# Patient Record
Sex: Male | Born: 1940
Health system: Southern US, Community
[De-identification: ages and names within clinical notes are randomized; demographics above are authoritative.]

## PROBLEM LIST (undated history)

## (undated) DIAGNOSIS — K259 Gastric ulcer, unspecified as acute or chronic, without hemorrhage or perforation: Secondary | ICD-10-CM

## (undated) DIAGNOSIS — J45909 Unspecified asthma, uncomplicated: Secondary | ICD-10-CM

## (undated) DIAGNOSIS — C189 Malignant neoplasm of colon, unspecified: Secondary | ICD-10-CM

## (undated) DIAGNOSIS — E785 Hyperlipidemia, unspecified: Secondary | ICD-10-CM

## (undated) DIAGNOSIS — I10 Essential (primary) hypertension: Secondary | ICD-10-CM

## (undated) DIAGNOSIS — K219 Gastro-esophageal reflux disease without esophagitis: Secondary | ICD-10-CM

## (undated) DIAGNOSIS — K589 Irritable bowel syndrome without diarrhea: Secondary | ICD-10-CM

## (undated) HISTORY — DX: Gastro-esophageal reflux disease without esophagitis: K21.9

## (undated) HISTORY — PX: REPLACEMENT TOTAL KNEE BILATERAL: SUR1225

## (undated) HISTORY — DX: Unspecified asthma, uncomplicated: J45.909

## (undated) HISTORY — PX: EXPLORATORY LAPAROTOMY W/ BOWEL RESECTION: SHX1544

## (undated) HISTORY — PX: NASAL SINUS SURGERY: SHX719

## (undated) HISTORY — DX: Irritable bowel syndrome, unspecified: K58.9

## (undated) HISTORY — PX: KNEE ARTHROSCOPY: SUR90

---

## 2011-11-23 HISTORY — PX: EXPLORATORY LAPAROTOMY W/ BOWEL RESECTION: SHX1544

## 2015-12-16 DIAGNOSIS — L600 Ingrowing nail: Secondary | ICD-10-CM | POA: Diagnosis not present

## 2015-12-22 DIAGNOSIS — L290 Pruritus ani: Secondary | ICD-10-CM | POA: Diagnosis not present

## 2015-12-22 DIAGNOSIS — K629 Disease of anus and rectum, unspecified: Secondary | ICD-10-CM | POA: Diagnosis not present

## 2015-12-22 DIAGNOSIS — R143 Flatulence: Secondary | ICD-10-CM | POA: Diagnosis not present

## 2016-01-29 DIAGNOSIS — L290 Pruritus ani: Secondary | ICD-10-CM | POA: Diagnosis not present

## 2016-01-29 DIAGNOSIS — K641 Second degree hemorrhoids: Secondary | ICD-10-CM | POA: Diagnosis not present

## 2016-01-29 DIAGNOSIS — D125 Benign neoplasm of sigmoid colon: Secondary | ICD-10-CM | POA: Diagnosis not present

## 2016-01-29 DIAGNOSIS — K621 Rectal polyp: Secondary | ICD-10-CM | POA: Diagnosis not present

## 2016-03-02 DIAGNOSIS — L600 Ingrowing nail: Secondary | ICD-10-CM | POA: Diagnosis not present

## 2016-03-19 DIAGNOSIS — E785 Hyperlipidemia, unspecified: Secondary | ICD-10-CM | POA: Diagnosis not present

## 2016-03-19 DIAGNOSIS — I10 Essential (primary) hypertension: Secondary | ICD-10-CM | POA: Diagnosis not present

## 2016-03-19 DIAGNOSIS — M109 Gout, unspecified: Secondary | ICD-10-CM | POA: Diagnosis not present

## 2016-03-19 DIAGNOSIS — F341 Dysthymic disorder: Secondary | ICD-10-CM | POA: Diagnosis not present

## 2016-03-19 DIAGNOSIS — E669 Obesity, unspecified: Secondary | ICD-10-CM | POA: Diagnosis not present

## 2016-03-30 DIAGNOSIS — C187 Malignant neoplasm of sigmoid colon: Secondary | ICD-10-CM | POA: Diagnosis not present

## 2016-04-14 DIAGNOSIS — F4323 Adjustment disorder with mixed anxiety and depressed mood: Secondary | ICD-10-CM | POA: Diagnosis not present

## 2016-04-21 DIAGNOSIS — F4323 Adjustment disorder with mixed anxiety and depressed mood: Secondary | ICD-10-CM | POA: Diagnosis not present

## 2016-04-26 DIAGNOSIS — F4323 Adjustment disorder with mixed anxiety and depressed mood: Secondary | ICD-10-CM | POA: Diagnosis not present

## 2016-05-06 DIAGNOSIS — F4323 Adjustment disorder with mixed anxiety and depressed mood: Secondary | ICD-10-CM | POA: Diagnosis not present

## 2016-05-11 DIAGNOSIS — L600 Ingrowing nail: Secondary | ICD-10-CM | POA: Diagnosis not present

## 2016-05-11 DIAGNOSIS — F4323 Adjustment disorder with mixed anxiety and depressed mood: Secondary | ICD-10-CM | POA: Diagnosis not present

## 2016-05-19 DIAGNOSIS — F4323 Adjustment disorder with mixed anxiety and depressed mood: Secondary | ICD-10-CM | POA: Diagnosis not present

## 2016-05-20 DIAGNOSIS — J018 Other acute sinusitis: Secondary | ICD-10-CM | POA: Diagnosis not present

## 2016-05-20 DIAGNOSIS — R05 Cough: Secondary | ICD-10-CM | POA: Diagnosis not present

## 2016-05-26 DIAGNOSIS — F4323 Adjustment disorder with mixed anxiety and depressed mood: Secondary | ICD-10-CM | POA: Diagnosis not present

## 2016-06-01 DIAGNOSIS — F4323 Adjustment disorder with mixed anxiety and depressed mood: Secondary | ICD-10-CM | POA: Diagnosis not present

## 2016-06-01 DIAGNOSIS — Z0000 Encounter for general adult medical examination without abnormal findings: Secondary | ICD-10-CM | POA: Diagnosis not present

## 2016-06-08 DIAGNOSIS — F4323 Adjustment disorder with mixed anxiety and depressed mood: Secondary | ICD-10-CM | POA: Diagnosis not present

## 2016-06-15 DIAGNOSIS — F4323 Adjustment disorder with mixed anxiety and depressed mood: Secondary | ICD-10-CM | POA: Diagnosis not present

## 2016-06-18 DIAGNOSIS — R918 Other nonspecific abnormal finding of lung field: Secondary | ICD-10-CM | POA: Diagnosis not present

## 2016-06-18 DIAGNOSIS — R509 Fever, unspecified: Secondary | ICD-10-CM | POA: Diagnosis not present

## 2016-06-18 DIAGNOSIS — J449 Chronic obstructive pulmonary disease, unspecified: Secondary | ICD-10-CM | POA: Diagnosis not present

## 2016-06-20 DIAGNOSIS — Z85038 Personal history of other malignant neoplasm of large intestine: Secondary | ICD-10-CM | POA: Diagnosis not present

## 2016-06-20 DIAGNOSIS — R600 Localized edema: Secondary | ICD-10-CM | POA: Diagnosis not present

## 2016-06-20 DIAGNOSIS — E7800 Pure hypercholesterolemia, unspecified: Secondary | ICD-10-CM | POA: Diagnosis not present

## 2016-06-20 DIAGNOSIS — Z96653 Presence of artificial knee joint, bilateral: Secondary | ICD-10-CM | POA: Diagnosis not present

## 2016-06-20 DIAGNOSIS — G4730 Sleep apnea, unspecified: Secondary | ICD-10-CM | POA: Diagnosis not present

## 2016-06-20 DIAGNOSIS — R0609 Other forms of dyspnea: Secondary | ICD-10-CM | POA: Diagnosis not present

## 2016-06-20 DIAGNOSIS — I10 Essential (primary) hypertension: Secondary | ICD-10-CM | POA: Diagnosis not present

## 2016-06-20 DIAGNOSIS — R0789 Other chest pain: Secondary | ICD-10-CM | POA: Diagnosis not present

## 2016-06-20 DIAGNOSIS — Z7982 Long term (current) use of aspirin: Secondary | ICD-10-CM | POA: Diagnosis not present

## 2016-06-20 DIAGNOSIS — E876 Hypokalemia: Secondary | ICD-10-CM | POA: Diagnosis not present

## 2016-06-20 DIAGNOSIS — R0600 Dyspnea, unspecified: Secondary | ICD-10-CM | POA: Diagnosis not present

## 2016-06-20 DIAGNOSIS — R609 Edema, unspecified: Secondary | ICD-10-CM | POA: Diagnosis not present

## 2016-06-22 DIAGNOSIS — M7989 Other specified soft tissue disorders: Secondary | ICD-10-CM | POA: Diagnosis not present

## 2016-06-22 DIAGNOSIS — Z7982 Long term (current) use of aspirin: Secondary | ICD-10-CM | POA: Diagnosis not present

## 2016-06-22 DIAGNOSIS — Z85038 Personal history of other malignant neoplasm of large intestine: Secondary | ICD-10-CM | POA: Diagnosis not present

## 2016-07-06 DIAGNOSIS — F4323 Adjustment disorder with mixed anxiety and depressed mood: Secondary | ICD-10-CM | POA: Diagnosis not present

## 2016-07-13 DIAGNOSIS — F4323 Adjustment disorder with mixed anxiety and depressed mood: Secondary | ICD-10-CM | POA: Diagnosis not present

## 2016-07-14 DIAGNOSIS — G4733 Obstructive sleep apnea (adult) (pediatric): Secondary | ICD-10-CM | POA: Diagnosis not present

## 2016-07-20 DIAGNOSIS — L600 Ingrowing nail: Secondary | ICD-10-CM | POA: Diagnosis not present

## 2016-07-20 DIAGNOSIS — F4323 Adjustment disorder with mixed anxiety and depressed mood: Secondary | ICD-10-CM | POA: Diagnosis not present

## 2016-07-22 DIAGNOSIS — M2550 Pain in unspecified joint: Secondary | ICD-10-CM | POA: Diagnosis not present

## 2016-07-22 DIAGNOSIS — J301 Allergic rhinitis due to pollen: Secondary | ICD-10-CM | POA: Diagnosis not present

## 2016-07-22 DIAGNOSIS — C189 Malignant neoplasm of colon, unspecified: Secondary | ICD-10-CM | POA: Diagnosis not present

## 2016-07-22 DIAGNOSIS — R600 Localized edema: Secondary | ICD-10-CM | POA: Diagnosis not present

## 2016-07-22 DIAGNOSIS — E785 Hyperlipidemia, unspecified: Secondary | ICD-10-CM | POA: Diagnosis not present

## 2016-07-22 DIAGNOSIS — I10 Essential (primary) hypertension: Secondary | ICD-10-CM | POA: Diagnosis not present

## 2016-07-22 DIAGNOSIS — E559 Vitamin D deficiency, unspecified: Secondary | ICD-10-CM | POA: Diagnosis not present

## 2016-07-22 DIAGNOSIS — E798 Other disorders of purine and pyrimidine metabolism: Secondary | ICD-10-CM | POA: Diagnosis not present

## 2016-07-22 DIAGNOSIS — E784 Other hyperlipidemia: Secondary | ICD-10-CM | POA: Diagnosis not present

## 2016-07-29 DIAGNOSIS — F4323 Adjustment disorder with mixed anxiety and depressed mood: Secondary | ICD-10-CM | POA: Diagnosis not present

## 2016-08-04 DIAGNOSIS — F4323 Adjustment disorder with mixed anxiety and depressed mood: Secondary | ICD-10-CM | POA: Diagnosis not present

## 2016-08-10 DIAGNOSIS — F4323 Adjustment disorder with mixed anxiety and depressed mood: Secondary | ICD-10-CM | POA: Diagnosis not present

## 2016-08-11 DIAGNOSIS — E876 Hypokalemia: Secondary | ICD-10-CM | POA: Diagnosis not present

## 2016-08-11 DIAGNOSIS — I10 Essential (primary) hypertension: Secondary | ICD-10-CM | POA: Diagnosis not present

## 2016-08-11 DIAGNOSIS — E782 Mixed hyperlipidemia: Secondary | ICD-10-CM | POA: Diagnosis not present

## 2016-08-11 DIAGNOSIS — R600 Localized edema: Secondary | ICD-10-CM | POA: Diagnosis not present

## 2016-08-11 DIAGNOSIS — R0602 Shortness of breath: Secondary | ICD-10-CM | POA: Diagnosis not present

## 2016-08-11 DIAGNOSIS — I471 Supraventricular tachycardia: Secondary | ICD-10-CM | POA: Diagnosis not present

## 2016-08-17 DIAGNOSIS — F4323 Adjustment disorder with mixed anxiety and depressed mood: Secondary | ICD-10-CM | POA: Diagnosis not present

## 2016-08-18 DIAGNOSIS — R0602 Shortness of breath: Secondary | ICD-10-CM | POA: Diagnosis not present

## 2016-08-18 DIAGNOSIS — R600 Localized edema: Secondary | ICD-10-CM | POA: Diagnosis not present

## 2016-08-18 DIAGNOSIS — R609 Edema, unspecified: Secondary | ICD-10-CM | POA: Diagnosis not present

## 2016-08-24 DIAGNOSIS — F4323 Adjustment disorder with mixed anxiety and depressed mood: Secondary | ICD-10-CM | POA: Diagnosis not present

## 2016-08-31 DIAGNOSIS — F4323 Adjustment disorder with mixed anxiety and depressed mood: Secondary | ICD-10-CM | POA: Diagnosis not present

## 2016-09-07 DIAGNOSIS — F4323 Adjustment disorder with mixed anxiety and depressed mood: Secondary | ICD-10-CM | POA: Diagnosis not present

## 2016-09-21 DIAGNOSIS — F4323 Adjustment disorder with mixed anxiety and depressed mood: Secondary | ICD-10-CM | POA: Diagnosis not present

## 2016-09-28 DIAGNOSIS — L600 Ingrowing nail: Secondary | ICD-10-CM | POA: Diagnosis not present

## 2016-09-29 DIAGNOSIS — F4323 Adjustment disorder with mixed anxiety and depressed mood: Secondary | ICD-10-CM | POA: Diagnosis not present

## 2016-10-05 DIAGNOSIS — F4323 Adjustment disorder with mixed anxiety and depressed mood: Secondary | ICD-10-CM | POA: Diagnosis not present

## 2016-10-19 DIAGNOSIS — F4323 Adjustment disorder with mixed anxiety and depressed mood: Secondary | ICD-10-CM | POA: Diagnosis not present

## 2016-10-26 DIAGNOSIS — F4323 Adjustment disorder with mixed anxiety and depressed mood: Secondary | ICD-10-CM | POA: Diagnosis not present

## 2016-11-09 DIAGNOSIS — F4323 Adjustment disorder with mixed anxiety and depressed mood: Secondary | ICD-10-CM | POA: Diagnosis not present

## 2016-11-30 DIAGNOSIS — J0140 Acute pansinusitis, unspecified: Secondary | ICD-10-CM | POA: Diagnosis not present

## 2016-12-07 DIAGNOSIS — F4323 Adjustment disorder with mixed anxiety and depressed mood: Secondary | ICD-10-CM | POA: Diagnosis not present

## 2016-12-08 DIAGNOSIS — E782 Mixed hyperlipidemia: Secondary | ICD-10-CM | POA: Diagnosis not present

## 2016-12-08 DIAGNOSIS — I471 Supraventricular tachycardia: Secondary | ICD-10-CM | POA: Diagnosis not present

## 2016-12-08 DIAGNOSIS — I10 Essential (primary) hypertension: Secondary | ICD-10-CM | POA: Diagnosis not present

## 2016-12-14 DIAGNOSIS — F4323 Adjustment disorder with mixed anxiety and depressed mood: Secondary | ICD-10-CM | POA: Diagnosis not present

## 2016-12-21 DIAGNOSIS — F4323 Adjustment disorder with mixed anxiety and depressed mood: Secondary | ICD-10-CM | POA: Diagnosis not present

## 2016-12-22 DIAGNOSIS — L600 Ingrowing nail: Secondary | ICD-10-CM | POA: Diagnosis not present

## 2016-12-28 DIAGNOSIS — F4323 Adjustment disorder with mixed anxiety and depressed mood: Secondary | ICD-10-CM | POA: Diagnosis not present

## 2017-01-04 DIAGNOSIS — F4323 Adjustment disorder with mixed anxiety and depressed mood: Secondary | ICD-10-CM | POA: Diagnosis not present

## 2017-01-11 DIAGNOSIS — F4323 Adjustment disorder with mixed anxiety and depressed mood: Secondary | ICD-10-CM | POA: Diagnosis not present

## 2017-01-18 DIAGNOSIS — F4323 Adjustment disorder with mixed anxiety and depressed mood: Secondary | ICD-10-CM | POA: Diagnosis not present

## 2017-02-15 DIAGNOSIS — F4323 Adjustment disorder with mixed anxiety and depressed mood: Secondary | ICD-10-CM | POA: Diagnosis not present

## 2017-02-22 DIAGNOSIS — F4323 Adjustment disorder with mixed anxiety and depressed mood: Secondary | ICD-10-CM | POA: Diagnosis not present

## 2017-03-02 DIAGNOSIS — L600 Ingrowing nail: Secondary | ICD-10-CM | POA: Diagnosis not present

## 2017-03-08 DIAGNOSIS — F4323 Adjustment disorder with mixed anxiety and depressed mood: Secondary | ICD-10-CM | POA: Diagnosis not present

## 2017-03-09 DIAGNOSIS — L821 Other seborrheic keratosis: Secondary | ICD-10-CM | POA: Diagnosis not present

## 2017-03-15 DIAGNOSIS — F4323 Adjustment disorder with mixed anxiety and depressed mood: Secondary | ICD-10-CM | POA: Diagnosis not present

## 2017-03-22 DIAGNOSIS — F4323 Adjustment disorder with mixed anxiety and depressed mood: Secondary | ICD-10-CM | POA: Diagnosis not present

## 2017-03-29 DIAGNOSIS — E782 Mixed hyperlipidemia: Secondary | ICD-10-CM | POA: Diagnosis not present

## 2017-03-29 DIAGNOSIS — I10 Essential (primary) hypertension: Secondary | ICD-10-CM | POA: Diagnosis not present

## 2017-03-29 DIAGNOSIS — F4323 Adjustment disorder with mixed anxiety and depressed mood: Secondary | ICD-10-CM | POA: Diagnosis not present

## 2017-03-29 DIAGNOSIS — F329 Major depressive disorder, single episode, unspecified: Secondary | ICD-10-CM | POA: Diagnosis not present

## 2017-03-29 DIAGNOSIS — N401 Enlarged prostate with lower urinary tract symptoms: Secondary | ICD-10-CM | POA: Diagnosis not present

## 2017-04-14 DIAGNOSIS — M25561 Pain in right knee: Secondary | ICD-10-CM | POA: Diagnosis not present

## 2017-04-14 DIAGNOSIS — M25562 Pain in left knee: Secondary | ICD-10-CM | POA: Diagnosis not present

## 2017-05-18 DIAGNOSIS — M79672 Pain in left foot: Secondary | ICD-10-CM | POA: Diagnosis not present

## 2017-05-18 DIAGNOSIS — I739 Peripheral vascular disease, unspecified: Secondary | ICD-10-CM | POA: Diagnosis not present

## 2017-05-18 DIAGNOSIS — L600 Ingrowing nail: Secondary | ICD-10-CM | POA: Diagnosis not present

## 2017-05-18 DIAGNOSIS — L603 Nail dystrophy: Secondary | ICD-10-CM | POA: Diagnosis not present

## 2017-05-18 DIAGNOSIS — M79671 Pain in right foot: Secondary | ICD-10-CM | POA: Diagnosis not present

## 2017-05-31 DIAGNOSIS — K644 Residual hemorrhoidal skin tags: Secondary | ICD-10-CM | POA: Diagnosis not present

## 2017-06-07 DIAGNOSIS — L600 Ingrowing nail: Secondary | ICD-10-CM | POA: Diagnosis not present

## 2017-06-20 DIAGNOSIS — N400 Enlarged prostate without lower urinary tract symptoms: Secondary | ICD-10-CM | POA: Diagnosis not present

## 2017-06-20 DIAGNOSIS — Z85038 Personal history of other malignant neoplasm of large intestine: Secondary | ICD-10-CM | POA: Diagnosis not present

## 2017-06-20 DIAGNOSIS — I10 Essential (primary) hypertension: Secondary | ICD-10-CM | POA: Diagnosis not present

## 2017-06-20 DIAGNOSIS — E782 Mixed hyperlipidemia: Secondary | ICD-10-CM | POA: Diagnosis not present

## 2017-06-20 DIAGNOSIS — G4733 Obstructive sleep apnea (adult) (pediatric): Secondary | ICD-10-CM | POA: Diagnosis not present

## 2017-06-20 DIAGNOSIS — F3342 Major depressive disorder, recurrent, in full remission: Secondary | ICD-10-CM | POA: Diagnosis not present

## 2017-06-20 DIAGNOSIS — J3089 Other allergic rhinitis: Secondary | ICD-10-CM | POA: Diagnosis not present

## 2017-07-04 DIAGNOSIS — K644 Residual hemorrhoidal skin tags: Secondary | ICD-10-CM | POA: Diagnosis not present

## 2017-08-01 DIAGNOSIS — D126 Benign neoplasm of colon, unspecified: Secondary | ICD-10-CM | POA: Diagnosis not present

## 2017-08-01 DIAGNOSIS — K640 First degree hemorrhoids: Secondary | ICD-10-CM | POA: Diagnosis not present

## 2017-08-01 DIAGNOSIS — Z980 Intestinal bypass and anastomosis status: Secondary | ICD-10-CM | POA: Diagnosis not present

## 2017-08-01 DIAGNOSIS — Z85038 Personal history of other malignant neoplasm of large intestine: Secondary | ICD-10-CM | POA: Diagnosis not present

## 2017-08-01 DIAGNOSIS — K5730 Diverticulosis of large intestine without perforation or abscess without bleeding: Secondary | ICD-10-CM | POA: Diagnosis not present

## 2017-08-03 DIAGNOSIS — D126 Benign neoplasm of colon, unspecified: Secondary | ICD-10-CM | POA: Diagnosis not present

## 2017-08-09 DIAGNOSIS — F4323 Adjustment disorder with mixed anxiety and depressed mood: Secondary | ICD-10-CM | POA: Diagnosis not present

## 2017-08-09 DIAGNOSIS — L600 Ingrowing nail: Secondary | ICD-10-CM | POA: Diagnosis not present

## 2017-08-16 DIAGNOSIS — F4323 Adjustment disorder with mixed anxiety and depressed mood: Secondary | ICD-10-CM | POA: Diagnosis not present

## 2017-09-29 DIAGNOSIS — L03031 Cellulitis of right toe: Secondary | ICD-10-CM | POA: Diagnosis not present

## 2017-09-29 DIAGNOSIS — L03032 Cellulitis of left toe: Secondary | ICD-10-CM | POA: Diagnosis not present

## 2017-09-29 DIAGNOSIS — L602 Onychogryphosis: Secondary | ICD-10-CM | POA: Diagnosis not present

## 2017-10-03 DIAGNOSIS — J0100 Acute maxillary sinusitis, unspecified: Secondary | ICD-10-CM | POA: Diagnosis not present

## 2017-10-06 DIAGNOSIS — F4323 Adjustment disorder with mixed anxiety and depressed mood: Secondary | ICD-10-CM | POA: Diagnosis not present

## 2017-10-17 DIAGNOSIS — J018 Other acute sinusitis: Secondary | ICD-10-CM | POA: Diagnosis not present

## 2017-12-01 DIAGNOSIS — L570 Actinic keratosis: Secondary | ICD-10-CM | POA: Diagnosis not present

## 2017-12-01 DIAGNOSIS — D485 Neoplasm of uncertain behavior of skin: Secondary | ICD-10-CM | POA: Diagnosis not present

## 2017-12-01 DIAGNOSIS — Z23 Encounter for immunization: Secondary | ICD-10-CM | POA: Diagnosis not present

## 2017-12-01 DIAGNOSIS — L738 Other specified follicular disorders: Secondary | ICD-10-CM | POA: Diagnosis not present

## 2017-12-01 DIAGNOSIS — D2262 Melanocytic nevi of left upper limb, including shoulder: Secondary | ICD-10-CM | POA: Diagnosis not present

## 2017-12-01 DIAGNOSIS — L821 Other seborrheic keratosis: Secondary | ICD-10-CM | POA: Diagnosis not present

## 2017-12-26 DIAGNOSIS — L821 Other seborrheic keratosis: Secondary | ICD-10-CM | POA: Diagnosis not present

## 2017-12-26 DIAGNOSIS — L570 Actinic keratosis: Secondary | ICD-10-CM | POA: Diagnosis not present

## 2018-01-24 DIAGNOSIS — L02612 Cutaneous abscess of left foot: Secondary | ICD-10-CM | POA: Diagnosis not present

## 2018-01-24 DIAGNOSIS — L03032 Cellulitis of left toe: Secondary | ICD-10-CM | POA: Diagnosis not present

## 2018-01-24 DIAGNOSIS — M79675 Pain in left toe(s): Secondary | ICD-10-CM | POA: Diagnosis not present

## 2018-02-08 DIAGNOSIS — L03032 Cellulitis of left toe: Secondary | ICD-10-CM | POA: Diagnosis not present

## 2018-02-08 DIAGNOSIS — L03031 Cellulitis of right toe: Secondary | ICD-10-CM | POA: Diagnosis not present

## 2018-02-27 DIAGNOSIS — L03031 Cellulitis of right toe: Secondary | ICD-10-CM | POA: Diagnosis not present

## 2018-03-06 DIAGNOSIS — K2900 Acute gastritis without bleeding: Secondary | ICD-10-CM | POA: Diagnosis not present

## 2018-03-16 DIAGNOSIS — G4733 Obstructive sleep apnea (adult) (pediatric): Secondary | ICD-10-CM | POA: Diagnosis not present

## 2018-03-19 DIAGNOSIS — R1013 Epigastric pain: Secondary | ICD-10-CM | POA: Diagnosis not present

## 2018-03-19 DIAGNOSIS — K120 Recurrent oral aphthae: Secondary | ICD-10-CM | POA: Diagnosis not present

## 2018-03-23 DIAGNOSIS — M170 Bilateral primary osteoarthritis of knee: Secondary | ICD-10-CM | POA: Diagnosis not present

## 2018-03-23 DIAGNOSIS — N400 Enlarged prostate without lower urinary tract symptoms: Secondary | ICD-10-CM | POA: Diagnosis not present

## 2018-03-23 DIAGNOSIS — E782 Mixed hyperlipidemia: Secondary | ICD-10-CM | POA: Diagnosis not present

## 2018-03-23 DIAGNOSIS — I10 Essential (primary) hypertension: Secondary | ICD-10-CM | POA: Diagnosis not present

## 2018-03-23 DIAGNOSIS — R1013 Epigastric pain: Secondary | ICD-10-CM | POA: Diagnosis not present

## 2018-03-27 DIAGNOSIS — R0789 Other chest pain: Secondary | ICD-10-CM | POA: Diagnosis not present

## 2018-03-28 ENCOUNTER — Observation Stay (HOSPITAL_COMMUNITY): Payer: Medicare Other

## 2018-03-28 ENCOUNTER — Emergency Department (HOSPITAL_COMMUNITY): Payer: Medicare Other

## 2018-03-28 ENCOUNTER — Inpatient Hospital Stay (HOSPITAL_COMMUNITY)
Admission: EM | Admit: 2018-03-28 | Discharge: 2018-03-30 | DRG: 419 | Disposition: A | Discharge status: Home / Self Care | Payer: Medicare Other | Attending: Internal Medicine | Admitting: Internal Medicine

## 2018-03-28 ENCOUNTER — Encounter (HOSPITAL_COMMUNITY): Payer: Self-pay

## 2018-03-28 ENCOUNTER — Other Ambulatory Visit: Payer: Self-pay

## 2018-03-28 DIAGNOSIS — Z23 Encounter for immunization: Secondary | ICD-10-CM

## 2018-03-28 DIAGNOSIS — I259 Chronic ischemic heart disease, unspecified: Secondary | ICD-10-CM

## 2018-03-28 DIAGNOSIS — E785 Hyperlipidemia, unspecified: Secondary | ICD-10-CM | POA: Diagnosis not present

## 2018-03-28 DIAGNOSIS — K829 Disease of gallbladder, unspecified: Secondary | ICD-10-CM | POA: Diagnosis not present

## 2018-03-28 DIAGNOSIS — K8020 Calculus of gallbladder without cholecystitis without obstruction: Secondary | ICD-10-CM | POA: Diagnosis not present

## 2018-03-28 DIAGNOSIS — Z79899 Other long term (current) drug therapy: Secondary | ICD-10-CM

## 2018-03-28 DIAGNOSIS — I10 Essential (primary) hypertension: Secondary | ICD-10-CM | POA: Diagnosis not present

## 2018-03-28 DIAGNOSIS — R079 Chest pain, unspecified: Secondary | ICD-10-CM | POA: Diagnosis not present

## 2018-03-28 DIAGNOSIS — R0789 Other chest pain: Secondary | ICD-10-CM | POA: Diagnosis not present

## 2018-03-28 DIAGNOSIS — R1011 Right upper quadrant pain: Secondary | ICD-10-CM | POA: Diagnosis not present

## 2018-03-28 DIAGNOSIS — K82A1 Gangrene of gallbladder in cholecystitis: Secondary | ICD-10-CM | POA: Diagnosis present

## 2018-03-28 DIAGNOSIS — Z8719 Personal history of other diseases of the digestive system: Secondary | ICD-10-CM

## 2018-03-28 DIAGNOSIS — Z6836 Body mass index (BMI) 36.0-36.9, adult: Secondary | ICD-10-CM

## 2018-03-28 DIAGNOSIS — K5792 Diverticulitis of intestine, part unspecified, without perforation or abscess without bleeding: Secondary | ICD-10-CM | POA: Diagnosis not present

## 2018-03-28 DIAGNOSIS — Z8711 Personal history of peptic ulcer disease: Secondary | ICD-10-CM

## 2018-03-28 DIAGNOSIS — K812 Acute cholecystitis with chronic cholecystitis: Principal | ICD-10-CM | POA: Diagnosis present

## 2018-03-28 DIAGNOSIS — E860 Dehydration: Secondary | ICD-10-CM | POA: Diagnosis present

## 2018-03-28 HISTORY — DX: Gastric ulcer, unspecified as acute or chronic, without hemorrhage or perforation: K25.9

## 2018-03-28 HISTORY — DX: Malignant neoplasm of colon, unspecified: C18.9

## 2018-03-28 HISTORY — DX: Hyperlipidemia, unspecified: E78.5

## 2018-03-28 HISTORY — DX: Essential (primary) hypertension: I10

## 2018-03-28 LAB — I-STAT CHEM 8, ED
BUN: 19 mg/dL (ref 6–20)
Calcium, Ion: 1.1 mmol/L — ABNORMAL LOW (ref 1.15–1.40)
Chloride: 98 mmol/L — ABNORMAL LOW (ref 101–111)
Creatinine, Ser: 1.2 mg/dL (ref 0.61–1.24)
Glucose, Bld: 106 mg/dL — ABNORMAL HIGH (ref 65–99)
HCT: 40 % (ref 39.0–52.0)
Hemoglobin: 13.6 g/dL (ref 13.0–17.0)
Potassium: 4 mmol/L (ref 3.5–5.1)
Sodium: 133 mmol/L — ABNORMAL LOW (ref 135–145)
TCO2: 27 mmol/L (ref 22–32)

## 2018-03-28 LAB — LIPID PANEL
Cholesterol: 101 mg/dL (ref 0–200)
HDL: 38 mg/dL — ABNORMAL LOW (ref >40)
LDL Cholesterol: 47 mg/dL (ref 0–99)
Total CHOL/HDL Ratio: 2.7 RATIO
Triglycerides: 78 mg/dL (ref <150)
VLDL: 16 mg/dL (ref 0–40)

## 2018-03-28 LAB — CBC WITH DIFFERENTIAL/PLATELET
Basophils Absolute: 0 10*3/uL (ref 0.0–0.1)
Basophils Relative: 0 %
Eosinophils Absolute: 0.4 10*3/uL (ref 0.0–0.7)
Eosinophils Relative: 4 %
HCT: 41.3 % (ref 39.0–52.0)
Hemoglobin: 14 g/dL (ref 13.0–17.0)
Lymphocytes Relative: 32 %
Lymphs Abs: 2.9 10*3/uL (ref 0.7–4.0)
MCH: 31 pg (ref 26.0–34.0)
MCHC: 33.9 g/dL (ref 30.0–36.0)
MCV: 91.4 fL (ref 78.0–100.0)
Monocytes Absolute: 0.7 10*3/uL (ref 0.1–1.0)
Monocytes Relative: 8 %
Neutro Abs: 4.9 10*3/uL (ref 1.7–7.7)
Neutrophils Relative %: 56 %
Platelets: 223 10*3/uL (ref 150–400)
RBC: 4.52 MIL/uL (ref 4.22–5.81)
RDW: 13.4 % (ref 11.5–15.5)
WBC: 8.9 10*3/uL (ref 4.0–10.5)

## 2018-03-28 LAB — I-STAT TROPONIN, ED: Troponin i, poc: 0.01 ng/mL (ref 0.00–0.08)

## 2018-03-28 LAB — HEMOGLOBIN A1C
Hgb A1c MFr Bld: 5.4 % (ref 4.8–5.6)
Mean Plasma Glucose: 108.28 mg/dL

## 2018-03-28 LAB — HEPATIC FUNCTION PANEL
ALT: 13 U/L — ABNORMAL LOW (ref 17–63)
AST: 18 U/L (ref 15–41)
Albumin: 3.6 g/dL (ref 3.5–5.0)
Alkaline Phosphatase: 75 U/L (ref 38–126)
Bilirubin, Direct: 0.3 mg/dL (ref 0.1–0.5)
Indirect Bilirubin: 0.8 mg/dL (ref 0.3–0.9)
Total Bilirubin: 1.1 mg/dL (ref 0.3–1.2)
Total Protein: 6.6 g/dL (ref 6.5–8.1)

## 2018-03-28 LAB — TROPONIN I
Troponin I: <0.03 ng/mL (ref <0.03)
Troponin I: <0.03 ng/mL (ref <0.03)
Troponin I: <0.03 ng/mL (ref <0.03)

## 2018-03-28 LAB — LIPASE, BLOOD: Lipase: 50 U/L (ref 11–51)

## 2018-03-28 LAB — MRSA PCR SCREENING: MRSA by PCR: NEGATIVE

## 2018-03-28 MED ORDER — PNEUMOCOCCAL VAC POLYVALENT 25 MCG/0.5ML IJ INJ: 0.5000 mL | INJECTION | INTRAMUSCULAR | Status: DC

## 2018-03-28 MED ORDER — ENOXAPARIN SODIUM 40 MG/0.4ML ~~LOC~~ SOLN
40.0000 mg | SUBCUTANEOUS | Status: DC
  Filled 2018-03-28: qty 0.4

## 2018-03-28 MED ORDER — SODIUM CHLORIDE 0.9 % IV SOLN
INTRAVENOUS | Status: DC
  Administered 2018-03-28 – 2018-03-30 (×3): via INTRAVENOUS

## 2018-03-28 MED ORDER — IOPAMIDOL (ISOVUE-370) INJECTION 76%
INTRAVENOUS | Status: AC
  Filled 2018-03-28: qty 100

## 2018-03-28 MED ORDER — ACETAMINOPHEN 325 MG PO TABS: 650.0000 mg | ORAL_TABLET | ORAL | Status: DC | PRN

## 2018-03-28 MED ORDER — MORPHINE SULFATE (PF) 4 MG/ML IV SOLN: 1.0000 mg | INTRAVENOUS | Status: DC | PRN

## 2018-03-28 MED ORDER — ATORVASTATIN CALCIUM 80 MG PO TABS
80.0000 mg | ORAL_TABLET | Freq: Every day | ORAL | Status: DC
  Administered 2018-03-28 – 2018-03-30 (×3): 80 mg via ORAL
  Filled 2018-03-28 (×3): qty 1

## 2018-03-28 MED ORDER — SERTRALINE HCL 50 MG PO TABS
50.0000 mg | ORAL_TABLET | Freq: Every day | ORAL | Status: DC
  Administered 2018-03-28 – 2018-03-30 (×3): 50 mg via ORAL
  Filled 2018-03-28 (×3): qty 1

## 2018-03-28 MED ORDER — GI COCKTAIL ~~LOC~~
30.0000 mL | Freq: Four times a day (QID) | ORAL | Status: DC | PRN
Start: 2018-03-28 — End: 2018-03-29

## 2018-03-28 MED ORDER — LORAZEPAM 2 MG/ML IJ SOLN
0.5000 mg | Freq: Once | INTRAMUSCULAR | Status: AC
  Administered 2018-03-28: 0.5 mg via INTRAVENOUS
  Filled 2018-03-28: qty 1

## 2018-03-28 MED ORDER — FENTANYL CITRATE (PF) 100 MCG/2ML IJ SOLN
100.0000 ug | Freq: Once | INTRAMUSCULAR | Status: DC
  Filled 2018-03-28: qty 2

## 2018-03-28 MED ORDER — ASPIRIN 325 MG PO TABS
325.0000 mg | ORAL_TABLET | Freq: Every day | ORAL | Status: DC
  Filled 2018-03-28 (×2): qty 1

## 2018-03-28 MED ORDER — SODIUM CHLORIDE 0.9 % IV BOLUS (SEPSIS)
1500.0000 mL | Freq: Once | INTRAVENOUS | Status: AC
  Administered 2018-03-28: 1500 mL via INTRAVENOUS

## 2018-03-28 MED ORDER — TAMSULOSIN HCL 0.4 MG PO CAPS
0.4000 mg | ORAL_CAPSULE | Freq: Every day | ORAL | Status: DC
  Administered 2018-03-28 – 2018-03-30 (×3): 0.4 mg via ORAL
  Filled 2018-03-28 (×3): qty 1

## 2018-03-28 MED ORDER — IOPAMIDOL (ISOVUE-370) INJECTION 76%
100.0000 mL | Freq: Once | INTRAVENOUS | Status: AC | PRN
  Administered 2018-03-28: 100 mL via INTRAVENOUS

## 2018-03-28 MED ORDER — SODIUM CHLORIDE 0.9 % IV BOLUS
1000.0000 mL | Freq: Once | INTRAVENOUS | Status: AC
  Administered 2018-03-28: 1000 mL via INTRAVENOUS

## 2018-03-28 MED ORDER — ALPRAZOLAM 0.25 MG PO TABS
0.2500 mg | ORAL_TABLET | Freq: Two times a day (BID) | ORAL | Status: DC | PRN
  Administered 2018-03-28 – 2018-03-29 (×2): 0.25 mg via ORAL
  Filled 2018-03-28 (×2): qty 1

## 2018-03-28 MED ORDER — FAMOTIDINE IN NACL 20-0.9 MG/50ML-% IV SOLN
20.0000 mg | Freq: Two times a day (BID) | INTRAVENOUS | Status: DC
  Administered 2018-03-28 – 2018-03-30 (×5): 20 mg via INTRAVENOUS
  Filled 2018-03-28 (×5): qty 50

## 2018-03-28 MED ORDER — GI COCKTAIL ~~LOC~~
30.0000 mL | Freq: Once | ORAL | Status: AC
  Administered 2018-03-28: 30 mL via ORAL
  Filled 2018-03-28: qty 30

## 2018-03-28 MED ORDER — TECHNETIUM TC 99M MEBROFENIN IV KIT
5.1000 | PACK | Freq: Once | INTRAVENOUS | Status: AC | PRN
  Administered 2018-03-28: 5.1 via INTRAVENOUS

## 2018-03-28 NOTE — ED Notes (Signed)
Patient returned from US.

## 2018-03-28 NOTE — ED Notes (Signed)
Pt ambulated to the bathroom without assistance with steady gait.

## 2018-03-28 NOTE — ED Triage Notes (Signed)
Pt coming from home with chest pain around 11 pm burning mid chest radiating to the back no cardiac hx no asa due to gastric ulcer 1 nitro with out relief pain 10/10 then given 17mcg of fentlyn and pain went to 4/10

## 2018-03-28 NOTE — ED Notes (Signed)
Pt returned from IR.

## 2018-03-28 NOTE — H&P (Signed)
History and Physical    Benjamin Stanley FTD:[id number] DOB: 09-Jul-1941 DOA: 03/28/2018   PCP: Patient, No Pcp Per   Patient coming from:  Home    Chief Complaint:  HPI: Benjamin Stanley is a 77 y.o. male with medical history significant for HTN, HLD, presenting with 3 to 5-hour history of substernal chest pain, waking him from sleep around 3 AM, accompanied by diaphoresis, without radiation to the jaw, severe, and dull.  Pain is associated with mild shortness of breath but no reductive cough..  He denies any abdominal pain, nausea vomiting or diaphoresis, no dizziness, no fever.  He denies any syncope or presyncope.  No palpitations.  He denies any hemoptysis.    Denies any lower extremity swelling or calf pain.  He denies any unilateral weakness.  Patient does not have any cardiac history, or has been seen by cardiologist in the past.  He denies any prior catheterization. he denies any history of PE, DVT, peripheral vascular disease, or gallbladder disease.  He did have a history of PUD 5 years ago, but he denies any endoscopies performed at that time.  The patient tried nitroglycerin, without relief.  He also was given 1 dose of GI cocktail, with mild relief.  Risk factors include male, obesity, age, sedentary lifestyle.  He denies any tobacco, alcohol or recreational drug use.  No recent long distance trips.  He denies any hormonal therapy.  He does not take an aspirin a day.    ED Course:  BP 122/77   Pulse 61   Temp 98 F (36.7 C) (Oral)   Resp 14   SpO2 99%   White count 8.9, hemoglobin 14, platelets 223 Creatinine 1.23. Troponin less than 0.03. Glucose 106. CT angios was negative for PE, or dissection, or any other abnormalities. Right upper quadrant ultrasound is pending. EKG sinus rhythm, borderline prolonged PR, interbowel borderline low voltage, QTC 421 The patient received GI cocktail as mentioned above, with some relief.  He also received fentanyl x1 BP was very low on  presentation, receiving 1 L of IV fluids, with good response.  These may have been related to dehydration.  Currently, his IV fluids are at the rate of 100 cc an hour, with good response.   Review of Systems:  As per HPI otherwise all other systems reviewed and are negative  Past Medical History:  Diagnosis Date  . Gastric ulcer   . Hyperlipidemia   . Hypertension     Past Surgical History:  Procedure Laterality Date  . NO PAST SURGERIES      Social History Social History   Socioeconomic History  . Marital status: Single    Spouse name: Not on file  . Number of children: Not on file  . Years of education: Not on file  . Highest education level: Not on file  Occupational History  . Not on file  Social Needs  . Financial resource strain: Not on file  . Food insecurity:    Worry: Not on file    Inability: Not on file  . Transportation needs:    Medical: Not on file    Non-medical: Not on file  Tobacco Use  . Smoking status: Never Smoker  . Smokeless tobacco: Never Used  Substance and Sexual Activity  . Alcohol use: Never    Frequency: Never  . Drug use: Never  . Sexual activity: Not on file  Lifestyle  . Physical activity:    Days per week: Not on file  Minutes per session: Not on file  . Stress: Not on file  Relationships  . Social connections:    Talks on phone: Not on file    Gets together: Not on file    Attends religious service: Not on file    Active member of club or organization: Not on file    Attends meetings of clubs or organizations: Not on file    Relationship status: Not on file  . Intimate partner violence:    Fear of current or ex partner: Not on file    Emotionally abused: Not on file    Physically abused: Not on file    Forced sexual activity: Not on file  Other Topics Concern  . Not on file  Social History Narrative  . Not on file     Allergies  Allergen Reactions  . Levaquin [Levofloxacin In D5w] Other (See Comments)    Joint  pain and upset stomach   . Promethazine Anxiety  . Sulfa Antibiotics Rash    History reviewed. No pertinent family history.    Prior to Admission medications   Medication Sig Start Date End Date Taking? Authorizing Provider  atorvastatin (LIPITOR) 80 MG tablet Take 80 mg by mouth daily. 01/08/18  Yes [provider]  cetirizine (ZYRTEC) 10 MG tablet Take 10 mg by mouth daily.   Yes [provider]  lisinopril-hydrochlorothiazide (PRINZIDE,ZESTORETIC) 20-12.5 MG tablet Take 1 tablet by mouth daily. 01/06/18  Yes [provider]  metoprolol succinate (TOPROL-XL) 100 MG 24 hr tablet Take 100 mg by mouth daily. 01/06/18  Yes [provider]  omeprazole (PRILOSEC) 40 MG capsule Take 40 mg by mouth daily. 03/06/18  Yes [provider]  sertraline (ZOLOFT) 50 MG tablet Take 50 mg by mouth daily. 01/27/18  Yes [provider]  tamsulosin (FLOMAX) 0.4 MG CAPS capsule Take 0.4 mg by mouth daily. 03/12/18  Yes [provider]    Physical Exam:  Vitals:   03/28/18 0415 03/28/18 0430 03/28/18 0700 03/28/18 0800  BP: 110/68 109/70 115/62 122/77  Pulse: 64 63 (!) 56 61  Resp: 20 19 16 14   Temp:      TempSrc:      SpO2: 100% 99% 97% 99%   Constitutional: NAD, calm, comfortable at this time  eyes: PERRL, lids and conjunctivae normal ENMT: Mucous membranes are moist, without exudate or lesions  Neck: normal, supple, no masses, no thyromegaly Respiratory: clear to auscultation bilaterally, no wheezing, no crackles. Normal respiratory effort  Cardiovascular: Distant sounds, regular rate and rhythm,  murmur, rubs or gallops. No extremity edema. 2+ pedal pulses. No carotid bruits.  Abdomen: Soft, obese non tender, No hepatosplenomegaly. Bowel sounds positive.  Musculoskeletal: no clubbing / cyanosis. Moves all extremities Skin: no jaundice, No lesions.  Neurologic: Sensation intact  Strength equal in all extremities Psychiatric:   Alert and  oriented x 3. Normal mood.     Labs on Admission: I have personally reviewed following labs and imaging studies  CBC: Recent Labs  Lab 03/28/18 0109 03/28/18 0115  WBC 8.9  --   NEUTROABS 4.9  --   HGB 14.0 13.6  HCT 41.3 40.0  MCV 91.4  --   PLT 223  --     Basic Metabolic Panel: Recent Labs  Lab 03/28/18 0115  NA 133*  K 4.0  CL 98*  GLUCOSE 106*  BUN 19  CREATININE 1.20    GFR: CrCl cannot be calculated (Unknown ideal weight.).  Liver Function Tests: Recent  Labs  Lab 03/28/18 0109  AST 18  ALT 13*  ALKPHOS 75  BILITOT 1.1  PROT 6.6  ALBUMIN 3.6   Recent Labs  Lab 03/28/18 0109  LIPASE 50   No results for input(s): AMMONIA in the last 168 hours.  Coagulation Profile: No results for input(s): INR, PROTIME in the last 168 hours.  Cardiac Enzymes: Recent Labs  Lab 03/28/18 0552  TROPONINI <0.03    BNP (last 3 results) No results for input(s): PROBNP in the last 8760 hours.  HbA1C: No results for input(s): HGBA1C in the last 72 hours.  CBG: No results for input(s): GLUCAP in the last 168 hours.  Lipid Profile: No results for input(s): CHOL, HDL, LDLCALC, TRIG, CHOLHDL, LDLDIRECT in the last 72 hours.  Thyroid Function Tests: No results for input(s): TSH, T4TOTAL, FREET4, T3FREE, THYROIDAB in the last 72 hours.  Anemia Panel: No results for input(s): VITAMINB12, FOLATE, FERRITIN, TIBC, IRON, RETICCTPCT in the last 72 hours.  Urine analysis: No results found for: COLORURINE, APPEARANCEUR, LABSPEC, PHURINE, GLUCOSEU, HGBUR, BILIRUBINUR, KETONESUR, PROTEINUR, UROBILINOGEN, NITRITE, LEUKOCYTESUR  Sepsis Labs: @LABRCNTIP (procalcitonin:4,lacticidven:4) )No results found for this or any previous visit (from the past 240 hour(s)).   Radiological Exams on Admission: Dg Chest Portable 1 View  Result Date: 03/28/2018 CLINICAL DATA:  Chest pain x1 day EXAM: PORTABLE CHEST 1 VIEW COMPARISON:  None. FINDINGS: Borderline cardiomegaly with aortic  atherosclerosis. Clear lungs without effusion or pulmonary edema. No pneumothorax. No pulmonary consolidation. No acute nor suspicious osseous abnormality. Osteoarthritis of the [facility] and glenohumeral joints bilaterally. IMPRESSION: No active disease.  Aortic atherosclerosis. Electronically Signed   By: Ashley Royalty M.D.   On: 03/28/2018 01:52   Ct Angio Chest/abd/pel For Dissection W And/or Wo Contrast  Result Date: 03/28/2018 CLINICAL DATA:  78 year old male with chest pain radiating to the back. EXAM: CT ANGIOGRAPHY CHEST, ABDOMEN AND PELVIS TECHNIQUE: Multidetector CT imaging through the chest, abdomen and pelvis was performed using the standard protocol during bolus administration of intravenous contrast. Multiplanar reconstructed images and MIPs were obtained and reviewed to evaluate the vascular anatomy. CONTRAST:  165mL ISOVUE-370 IOPAMIDOL (ISOVUE-370) INJECTION 76% COMPARISON:  Chest radiograph dated 03/28/2018 FINDINGS: CTA CHEST FINDINGS Cardiovascular: There is no cardiomegaly or pericardial effusion. There is coronary vascular calcification. There is mild atherosclerotic calcification of the thoracic aorta. No aneurysmal dilatation or evidence of dissection. The origins of the great vessels of the aortic arch appear patent. There is no CT evidence of pulmonary embolism. Mediastinum/Nodes: No hilar or mediastinal adenopathy. Esophagus and the thyroid gland are grossly unremarkable. No mediastinal fluid collection. Lungs/Pleura: Minimal bibasilar linear atelectasis/scarring. There is no focal consolidation, pleural effusion, or pneumothorax. The central airways are patent. Musculoskeletal: There is degenerative changes of the spine. No acute osseous pathology. Review of the MIP images confirms the above findings. CTA ABDOMEN AND PELVIS FINDINGS VASCULAR Aorta: Mild atherosclerotic calcification. No aneurysmal dilatation or dissection. Celiac: Patent without evidence of aneurysm, dissection, vasculitis or  significant stenosis. SMA: Mild noncalcified plaque in the origin of the SMA with minimal luminal narrowing. The SMA is patent. Renals: Atherosclerotic calcification of the renal ostia. The renal arteries are patent. IMA: Patent without evidence of aneurysm, dissection, vasculitis or significant stenosis. Inflow: Mild atherosclerotic calcification. No aneurysm or dissection. The iliac arteries are patent. Veins: No obvious venous abnormality within the limitations of this arterial phase study. Review of the MIP images confirms the above findings. NON-VASCULAR No intra-abdominal free air or free fluid. Hepatobiliary: No focal liver abnormality is  seen. No gallstones, gallbladder wall thickening, or biliary dilatation. Pancreas: Unremarkable. No pancreatic ductal dilatation or surrounding inflammatory changes. Spleen: Normal in size without focal abnormality. Adrenals/Urinary Tract: A 13 mm right adrenal adenoma. The left adrenal gland is unremarkable. Bilateral renal cysts as well as smaller hypodensities which are not characterized. There is no hydronephrosis on either side. The visualized ureters and urinary bladder appear unremarkable. Stomach/Bowel: Small hiatal hernia. There is postsurgical changes of the bowel. No bowel obstruction or active inflammation. There is colonic diverticulosis as well as a 15 mm duodenal diverticulum without active inflammatory changes. The appendix is normal. Lymphatic: No adenopathy. Reproductive: The prostate is mildly enlarged measuring approximately 7 cm in transverse axial diameter. Other: Midline vertical anterior abdominal wall incisional scar. Small fat containing umbilical hernia. Musculoskeletal: Multilevel degenerative changes of the spine. No acute osseous pathology. Indeterminate 13 mm sclerotic focus in the L3 vertebra, possibly a bone island. Review of the MIP images confirms the above findings. IMPRESSION: 1. No acute intrathoracic, abdominal, or pelvic pathology. No  aortic aneurysm or dissection. No CT evidence of pulmonary embolism. 2. Coronary vascular calcification, and Aortic Atherosclerosis (ICD10-I70.0). 3. Diverticulosis of the colon and duodenum. No bowel obstruction or active inflammation. Normal appendix. Electronically Signed   By: Anner Crete M.D.   On: 03/28/2018 04:08    EKG: Independently reviewed. EKG sinus rhythm, borderline prolonged PR, interbowel borderline low voltage, QTC 421 Assessment/Plan Principal Problem:   Chest pain Active Problems:   Hypertension   History of gastric ulcer   Hyperlipidemia    Chest pain syndrome, cardiac versus musculoskeletal vs GI in a patient with remote PUD, also r/o choloelithiasis HEART score 4 . Troponin neg , EKG without evidence of acute changes. CP unrelieved by nitroglycerin, pain meds  CXR unrevealing. CT angio neg for PE or dissection   Admit to Telemetry/ Observation Chest pain order set Cycle troponins EKG in am  ASA, O2 and NTG as needed GI cocktail Check Lipid panel  Hb A1C GB US    Hypotension, likely med related, and dehydration  the patient states that he has been losing weight, total of 25 pounds intentionally, and has been taking blood pressure medication at home, which may be of a higher dose that he needs.  Septic shock is unlikely, CT Angie of the chest is negative for PE or dissection.  The patient is afebrile.  White count is normal.  The patient blood pressure has improved with IV fluid bolus, currently at 100 cc an hour.  He reports feeling better.   Check Orthostatics  IVF NS  EKG    Hypertension BP 122/77   Pulse 61   Temp 98 F (36.7 C) (Oral)   Resp 14   SpO2 99%  home anti-hypertensive medications on hold for now due to above, may resume prior to d/c or in BP rises   Hyperlipidemia Continue home statins CHecl lipid panel  History of PUD, no EGD performed at the time  No bleeding issues at this time. Lipase normal  RUQ [place]  Pepcid bid IV and GI  cocktail Will need GI evaluation as Outpatient if symptoms  do not improve       DVT prophylaxis:  Lovenox Code Status:   Full  Family Communication:  Discussed with patient Disposition Plan: Expect patient to be discharged to home after condition improves Consults called:    None  Admission status: Tele Obs    Sharene Butters, PA-C [facility]   Amion text  [phone]   03/28/2018, 9:05 AM

## 2018-03-28 NOTE — ED Notes (Signed)
Pt transported to [facility] 

## 2018-03-28 NOTE — ED Provider Notes (Signed)
[facility] Provider Note   CSN: [id number] Arrival date & time: 03/28/18  0032     History   Chief Complaint Chief Complaint  Patient presents with  . Chest Pain    HPI Gregroy Dombkowski is a 77 y.o. male.  The history is provided by the patient.  Chest Pain   This is a new problem. The current episode started 3 to 5 hours ago. The problem occurs constantly. The problem has not changed since onset.The pain is associated with rest. The pain is present in the epigastric region. The pain is severe. The quality of the pain is described as dull. The pain does not radiate. Associated symptoms include shortness of breath. Pertinent negatives include no abdominal pain, no diaphoresis, no dizziness, no fever, no hemoptysis, no irregular heartbeat, no leg pain, no palpitations, no PND, no sputum production, no syncope, no vomiting and no weakness. He has tried nitroglycerin for the symptoms. The treatment provided no (made worse) relief. Risk factors include male gender, obesity and sedentary lifestyle.  His past medical history is significant for hyperlipidemia.  Pertinent negatives for past medical history include no aortic dissection, no PE, no PVD and Turner syndrome.  Pertinent negatives for family medical history include: no aortic dissection.  Procedure history is negative for cardiac catheterization.    Past Medical History:  Diagnosis Date  . Gastric ulcer   . Hyperlipidemia   . Hypertension     There are no active problems to display for this patient.   History reviewed. No pertinent surgical history.      Home Medications    Prior to Admission medications   Medication Sig Start Date End Date Taking? Authorizing Provider  atorvastatin (LIPITOR) 80 MG tablet Take 80 mg by mouth daily. 01/08/18  Yes [provider]  cetirizine (ZYRTEC) 10 MG tablet Take 10 mg by mouth daily.   Yes [provider]    lisinopril-hydrochlorothiazide (PRINZIDE,ZESTORETIC) 20-12.5 MG tablet Take 1 tablet by mouth daily. 01/06/18  Yes [provider]  metoprolol succinate (TOPROL-XL) 100 MG 24 hr tablet Take 100 mg by mouth daily. 01/06/18  Yes [provider]  omeprazole (PRILOSEC) 40 MG capsule Take 40 mg by mouth daily. 03/06/18  Yes [provider]  sertraline (ZOLOFT) 50 MG tablet Take 50 mg by mouth daily. 01/27/18  Yes [provider]  tamsulosin (FLOMAX) 0.4 MG CAPS capsule Take 0.4 mg by mouth daily. 03/12/18  Yes [provider]    Family History History reviewed. No pertinent family history.  Social History Social History   Tobacco Use  . Smoking status: Never Smoker  . Smokeless tobacco: Never Used  Substance Use Topics  . Alcohol use: Never    Frequency: Never  . Drug use: Never     Allergies   Levaquin [levofloxacin in d5w]; Promethazine; and Sulfa antibiotics   Review of Systems Review of Systems  Constitutional: Negative for diaphoresis and fever.  Respiratory: Positive for shortness of breath. Negative for hemoptysis and sputum production.   Cardiovascular: Positive for chest pain. Negative for palpitations, leg swelling, syncope and PND.  Gastrointestinal: Negative for abdominal pain and vomiting.  Neurological: Negative for dizziness and weakness.  All other systems reviewed and are negative.    Physical Exam Updated Vital Signs BP 114/62   Pulse 66   Temp 98 F (36.7 C) (Oral)   Resp 17   SpO2 100%   Physical Exam  Constitutional: He is oriented to person,  place, and time. He appears well-developed and well-nourished. No distress.  HENT:  Head: Normocephalic and atraumatic.  Nose: Nose normal.  Mouth/Throat: No oropharyngeal exudate.  Eyes: Pupils are equal, round, and reactive to light. Conjunctivae are normal.  Neck: Normal range of motion. Neck supple. No JVD present. No tracheal deviation present.  Cardiovascular:  Normal rate, regular rhythm, normal heart sounds and intact distal pulses.  Pulmonary/Chest: Effort normal and breath sounds normal. No stridor. He has no wheezes. He has no rales.  Abdominal: Soft. Bowel sounds are normal. He exhibits no mass. There is no tenderness. There is no rebound and no guarding.  Musculoskeletal: Normal range of motion. He exhibits no edema.  Neurological: He is alert and oriented to person, place, and time. He displays normal reflexes.  Skin: Skin is warm and dry. Capillary refill takes less than 2 seconds.  Psychiatric: He has a normal mood and affect.     ED Treatments / Results  Labs (all labs ordered are listed, but only abnormal results are displayed) Results for orders placed or performed during the hospital encounter of 03/28/18  CBC with Differential/Platelet  Result Value Ref Range   WBC 8.9 4.0 - 10.5 K/uL   RBC 4.52 4.22 - 5.81 MIL/uL   Hemoglobin 14.0 13.0 - 17.0 g/dL   HCT 41.3 39.0 - 52.0 %   MCV 91.4 78.0 - 100.0 fL   MCH 31.0 26.0 - 34.0 pg   MCHC 33.9 30.0 - 36.0 g/dL   RDW 13.4 11.5 - 15.5 %   Platelets 223 150 - 400 K/uL   Neutrophils Relative % 56 %   Neutro Abs 4.9 1.7 - 7.7 K/uL   Lymphocytes Relative 32 %   Lymphs Abs 2.9 0.7 - 4.0 K/uL   Monocytes Relative 8 %   Monocytes Absolute 0.7 0.1 - 1.0 K/uL   Eosinophils Relative 4 %   Eosinophils Absolute 0.4 0.0 - 0.7 K/uL   Basophils Relative 0 %   Basophils Absolute 0.0 0.0 - 0.1 K/uL  Hepatic function panel  Result Value Ref Range   Total Protein 6.6 6.5 - 8.1 g/dL   Albumin 3.6 3.5 - 5.0 g/dL   AST 18 15 - 41 U/L   ALT 13 (L) 17 - 63 U/L   Alkaline Phosphatase 75 38 - 126 U/L   Total Bilirubin 1.1 0.3 - 1.2 mg/dL   Bilirubin, Direct 0.3 0.1 - 0.5 mg/dL   Indirect Bilirubin 0.8 0.3 - 0.9 mg/dL  Lipase, blood  Result Value Ref Range   Lipase 50 11 - 51 U/L  I-Stat Chem 8, ED  Result Value Ref Range   Sodium 133 (L) 135 - 145 mmol/L   Potassium 4.0 3.5 - 5.1 mmol/L    Chloride 98 (L) 101 - 111 mmol/L   BUN 19 6 - 20 mg/dL   Creatinine, Ser 1.20 0.61 - 1.24 mg/dL   Glucose, Bld 106 (H) 65 - 99 mg/dL   Calcium, Ion 1.10 (L) 1.15 - 1.40 mmol/L   TCO2 27 22 - 32 mmol/L   Hemoglobin 13.6 13.0 - 17.0 g/dL   HCT 40.0 39.0 - 52.0 %  I-stat troponin, ED  Result Value Ref Range   Troponin i, poc 0.01 0.00 - 0.08 ng/mL   Comment 3           Dg Chest Portable 1 View  Result Date: 03/28/2018 CLINICAL DATA:  Chest pain x1 day EXAM: PORTABLE CHEST 1 VIEW COMPARISON:  None. FINDINGS: Borderline cardiomegaly  with aortic atherosclerosis. Clear lungs without effusion or pulmonary edema. No pneumothorax. No pulmonary consolidation. No acute nor suspicious osseous abnormality. Osteoarthritis of the [facility] and glenohumeral joints bilaterally. IMPRESSION: No active disease.  Aortic atherosclerosis. Electronically Signed   By: Ashley Royalty M.D.   On: 03/28/2018 01:52   Ct Angio Chest/abd/pel For Dissection W And/or Wo Contrast  Result Date: 03/28/2018 CLINICAL DATA:  77 year old male with chest pain radiating to the back. EXAM: CT ANGIOGRAPHY CHEST, ABDOMEN AND PELVIS TECHNIQUE: Multidetector CT imaging through the chest, abdomen and pelvis was performed using the standard protocol during bolus administration of intravenous contrast. Multiplanar reconstructed images and MIPs were obtained and reviewed to evaluate the vascular anatomy. CONTRAST:  141mL ISOVUE-370 IOPAMIDOL (ISOVUE-370) INJECTION 76% COMPARISON:  Chest radiograph dated 03/28/2018 FINDINGS: CTA CHEST FINDINGS Cardiovascular: There is no cardiomegaly or pericardial effusion. There is coronary vascular calcification. There is mild atherosclerotic calcification of the thoracic aorta. No aneurysmal dilatation or evidence of dissection. The origins of the great vessels of the aortic arch appear patent. There is no CT evidence of pulmonary embolism. Mediastinum/Nodes: No hilar or mediastinal adenopathy. Esophagus and the thyroid  gland are grossly unremarkable. No mediastinal fluid collection. Lungs/Pleura: Minimal bibasilar linear atelectasis/scarring. There is no focal consolidation, pleural effusion, or pneumothorax. The central airways are patent. Musculoskeletal: There is degenerative changes of the spine. No acute osseous pathology. Review of the MIP images confirms the above findings. CTA ABDOMEN AND PELVIS FINDINGS VASCULAR Aorta: Mild atherosclerotic calcification. No aneurysmal dilatation or dissection. Celiac: Patent without evidence of aneurysm, dissection, vasculitis or significant stenosis. SMA: Mild noncalcified plaque in the origin of the SMA with minimal luminal narrowing. The SMA is patent. Renals: Atherosclerotic calcification of the renal ostia. The renal arteries are patent. IMA: Patent without evidence of aneurysm, dissection, vasculitis or significant stenosis. Inflow: Mild atherosclerotic calcification. No aneurysm or dissection. The iliac arteries are patent. Veins: No obvious venous abnormality within the limitations of this arterial phase study. Review of the MIP images confirms the above findings. NON-VASCULAR No intra-abdominal free air or free fluid. Hepatobiliary: No focal liver abnormality is seen. No gallstones, gallbladder wall thickening, or biliary dilatation. Pancreas: Unremarkable. No pancreatic ductal dilatation or surrounding inflammatory changes. Spleen: Normal in size without focal abnormality. Adrenals/Urinary Tract: A 13 mm right adrenal adenoma. The left adrenal gland is unremarkable. Bilateral renal cysts as well as smaller hypodensities which are not characterized. There is no hydronephrosis on either side. The visualized ureters and urinary bladder appear unremarkable. Stomach/Bowel: Small hiatal hernia. There is postsurgical changes of the bowel. No bowel obstruction or active inflammation. There is colonic diverticulosis as well as a 15 mm duodenal diverticulum without active inflammatory  changes. The appendix is normal. Lymphatic: No adenopathy. Reproductive: The prostate is mildly enlarged measuring approximately 7 cm in transverse axial diameter. Other: Midline vertical anterior abdominal wall incisional scar. Small fat containing umbilical hernia. Musculoskeletal: Multilevel degenerative changes of the spine. No acute osseous pathology. Indeterminate 13 mm sclerotic focus in the L3 vertebra, possibly a bone island. Review of the MIP images confirms the above findings. IMPRESSION: 1. No acute intrathoracic, abdominal, or pelvic pathology. No aortic aneurysm or dissection. No CT evidence of pulmonary embolism. 2. Coronary vascular calcification, and Aortic Atherosclerosis (ICD10-I70.0). 3. Diverticulosis of the colon and duodenum. No bowel obstruction or active inflammation. Normal appendix. Electronically Signed   By: Anner Crete M.D.   On: 03/28/2018 04:08    EKG EKG Interpretation  Date/Time:  Tuesday Mar 28 2018  00:44:49 EDT Ventricular Rate:  74 PR Interval:    QRS Duration: 105 QT Interval:  379 QTC Calculation: 421 R Axis:   15 Text Interpretation:  Sinus rhythm Borderline prolonged PR interval Borderline low voltage, extremity leads Confirmed by Randal Buba, Kong Packett ([id number]) on 03/28/2018 12:56:30 AM   Radiology Dg Chest Portable 1 View  Result Date: 03/28/2018 CLINICAL DATA:  Chest pain x1 day EXAM: PORTABLE CHEST 1 VIEW COMPARISON:  None. FINDINGS: Borderline cardiomegaly with aortic atherosclerosis. Clear lungs without effusion or pulmonary edema. No pneumothorax. No pulmonary consolidation. No acute nor suspicious osseous abnormality. Osteoarthritis of the [facility] and glenohumeral joints bilaterally. IMPRESSION: No active disease.  Aortic atherosclerosis. Electronically Signed   By: Ashley Royalty M.D.   On: 03/28/2018 01:52   Ct Angio Chest/abd/pel For Dissection W And/or Wo Contrast  Result Date: 03/28/2018 CLINICAL DATA:  77 year old male with chest pain radiating to the  back. EXAM: CT ANGIOGRAPHY CHEST, ABDOMEN AND PELVIS TECHNIQUE: Multidetector CT imaging through the chest, abdomen and pelvis was performed using the standard protocol during bolus administration of intravenous contrast. Multiplanar reconstructed images and MIPs were obtained and reviewed to evaluate the vascular anatomy. CONTRAST:  114mL ISOVUE-370 IOPAMIDOL (ISOVUE-370) INJECTION 76% COMPARISON:  Chest radiograph dated 03/28/2018 FINDINGS: CTA CHEST FINDINGS Cardiovascular: There is no cardiomegaly or pericardial effusion. There is coronary vascular calcification. There is mild atherosclerotic calcification of the thoracic aorta. No aneurysmal dilatation or evidence of dissection. The origins of the great vessels of the aortic arch appear patent. There is no CT evidence of pulmonary embolism. Mediastinum/Nodes: No hilar or mediastinal adenopathy. Esophagus and the thyroid gland are grossly unremarkable. No mediastinal fluid collection. Lungs/Pleura: Minimal bibasilar linear atelectasis/scarring. There is no focal consolidation, pleural effusion, or pneumothorax. The central airways are patent. Musculoskeletal: There is degenerative changes of the spine. No acute osseous pathology. Review of the MIP images confirms the above findings. CTA ABDOMEN AND PELVIS FINDINGS VASCULAR Aorta: Mild atherosclerotic calcification. No aneurysmal dilatation or dissection. Celiac: Patent without evidence of aneurysm, dissection, vasculitis or significant stenosis. SMA: Mild noncalcified plaque in the origin of the SMA with minimal luminal narrowing. The SMA is patent. Renals: Atherosclerotic calcification of the renal ostia. The renal arteries are patent. IMA: Patent without evidence of aneurysm, dissection, vasculitis or significant stenosis. Inflow: Mild atherosclerotic calcification. No aneurysm or dissection. The iliac arteries are patent. Veins: No obvious venous abnormality within the limitations of this arterial phase  study. Review of the MIP images confirms the above findings. NON-VASCULAR No intra-abdominal free air or free fluid. Hepatobiliary: No focal liver abnormality is seen. No gallstones, gallbladder wall thickening, or biliary dilatation. Pancreas: Unremarkable. No pancreatic ductal dilatation or surrounding inflammatory changes. Spleen: Normal in size without focal abnormality. Adrenals/Urinary Tract: A 13 mm right adrenal adenoma. The left adrenal gland is unremarkable. Bilateral renal cysts as well as smaller hypodensities which are not characterized. There is no hydronephrosis on either side. The visualized ureters and urinary bladder appear unremarkable. Stomach/Bowel: Small hiatal hernia. There is postsurgical changes of the bowel. No bowel obstruction or active inflammation. There is colonic diverticulosis as well as a 15 mm duodenal diverticulum without active inflammatory changes. The appendix is normal. Lymphatic: No adenopathy. Reproductive: The prostate is mildly enlarged measuring approximately 7 cm in transverse axial diameter. Other: Midline vertical anterior abdominal wall incisional scar. Small fat containing umbilical hernia. Musculoskeletal: Multilevel degenerative changes of the spine. No acute osseous pathology. Indeterminate 13 mm sclerotic focus in the L3 vertebra, possibly a bone island.  Review of the MIP images confirms the above findings. IMPRESSION: 1. No acute intrathoracic, abdominal, or pelvic pathology. No aortic aneurysm or dissection. No CT evidence of pulmonary embolism. 2. Coronary vascular calcification, and Aortic Atherosclerosis (ICD10-I70.0). 3. Diverticulosis of the colon and duodenum. No bowel obstruction or active inflammation. Normal appendix. Electronically Signed   By: Anner Crete M.D.   On: 03/28/2018 04:08    Procedures Procedures (including critical care time)  Medications Ordered in ED Medications  iopamidol (ISOVUE-370) 76 % injection (has no administration  in time range)  fentaNYL (SUBLIMAZE) injection 100 mcg (has no administration in time range)  gi cocktail (Maalox,Lidocaine,Donnatal) (has no administration in time range)  sodium chloride 0.9 % bolus 1,000 mL (0 mLs Intravenous Stopped 03/28/18 0212)  iopamidol (ISOVUE-370) 76 % injection 100 mL (100 mLs Intravenous Contrast Given 03/28/18 0255)       Final Clinical Impressions(s) / ED Diagnoses   Chest pain; based on HEART score will admit for rule out.     Fortino Haag, MD 03/28/18 [phone]

## 2018-03-28 NOTE — ED Notes (Signed)
Patient transported to Ultrasound 

## 2018-03-29 ENCOUNTER — Observation Stay (HOSPITAL_COMMUNITY): Payer: Medicare Other | Admitting: Anesthesiology

## 2018-03-29 ENCOUNTER — Encounter (HOSPITAL_COMMUNITY): Payer: Self-pay | Admitting: General Surgery

## 2018-03-29 ENCOUNTER — Encounter (HOSPITAL_COMMUNITY)
Admission: EM | Disposition: A | Discharge status: Home / Self Care | Payer: Self-pay | Source: Home / Self Care | Attending: Internal Medicine

## 2018-03-29 DIAGNOSIS — E785 Hyperlipidemia, unspecified: Secondary | ICD-10-CM | POA: Diagnosis not present

## 2018-03-29 DIAGNOSIS — K8020 Calculus of gallbladder without cholecystitis without obstruction: Secondary | ICD-10-CM | POA: Diagnosis not present

## 2018-03-29 DIAGNOSIS — R0789 Other chest pain: Secondary | ICD-10-CM

## 2018-03-29 DIAGNOSIS — K811 Chronic cholecystitis: Secondary | ICD-10-CM | POA: Diagnosis not present

## 2018-03-29 DIAGNOSIS — Z6836 Body mass index (BMI) 36.0-36.9, adult: Secondary | ICD-10-CM | POA: Diagnosis not present

## 2018-03-29 DIAGNOSIS — Z23 Encounter for immunization: Secondary | ICD-10-CM | POA: Diagnosis not present

## 2018-03-29 DIAGNOSIS — E860 Dehydration: Secondary | ICD-10-CM | POA: Diagnosis not present

## 2018-03-29 DIAGNOSIS — I10 Essential (primary) hypertension: Secondary | ICD-10-CM | POA: Diagnosis not present

## 2018-03-29 DIAGNOSIS — K8012 Calculus of gallbladder with acute and chronic cholecystitis without obstruction: Secondary | ICD-10-CM | POA: Diagnosis not present

## 2018-03-29 DIAGNOSIS — K812 Acute cholecystitis with chronic cholecystitis: Secondary | ICD-10-CM | POA: Diagnosis not present

## 2018-03-29 DIAGNOSIS — K8066 Calculus of gallbladder and bile duct with acute and chronic cholecystitis without obstruction: Secondary | ICD-10-CM | POA: Diagnosis not present

## 2018-03-29 DIAGNOSIS — Z8711 Personal history of peptic ulcer disease: Secondary | ICD-10-CM | POA: Diagnosis not present

## 2018-03-29 DIAGNOSIS — Z79899 Other long term (current) drug therapy: Secondary | ICD-10-CM | POA: Diagnosis not present

## 2018-03-29 DIAGNOSIS — K82A1 Gangrene of gallbladder in cholecystitis: Secondary | ICD-10-CM | POA: Diagnosis not present

## 2018-03-29 HISTORY — PX: CHOLECYSTECTOMY: SHX55

## 2018-03-29 LAB — CBC
HCT: 36.5 % — ABNORMAL LOW (ref 39.0–52.0)
Hemoglobin: 12.2 g/dL — ABNORMAL LOW (ref 13.0–17.0)
MCH: 30.3 pg (ref 26.0–34.0)
MCHC: 33.4 g/dL (ref 30.0–36.0)
MCV: 90.6 fL (ref 78.0–100.0)
Platelets: 179 10*3/uL (ref 150–400)
RBC: 4.03 MIL/uL — ABNORMAL LOW (ref 4.22–5.81)
RDW: 13 % (ref 11.5–15.5)
WBC: 8.6 10*3/uL (ref 4.0–10.5)

## 2018-03-29 LAB — BASIC METABOLIC PANEL
Anion gap: 6 (ref 5–15)
BUN: 10 mg/dL (ref 6–20)
CO2: 25 mmol/L (ref 22–32)
Calcium: 7.9 mg/dL — ABNORMAL LOW (ref 8.9–10.3)
Chloride: 105 mmol/L (ref 101–111)
Creatinine, Ser: 1.03 mg/dL (ref 0.61–1.24)
GFR calc Af Amer: >60 mL/min (ref >60)
GFR calc non Af Amer: >60 mL/min (ref >60)
Glucose, Bld: 125 mg/dL — ABNORMAL HIGH (ref 65–99)
Potassium: 3.4 mmol/L — ABNORMAL LOW (ref 3.5–5.1)
Sodium: 136 mmol/L (ref 135–145)

## 2018-03-29 SURGERY — LAPAROSCOPIC CHOLECYSTECTOMY
Anesthesia: General | Site: Abdomen

## 2018-03-29 MED ORDER — SUGAMMADEX SODIUM 500 MG/5ML IV SOLN
INTRAVENOUS | Status: AC
  Filled 2018-03-29: qty 5

## 2018-03-29 MED ORDER — HEMOSTATIC AGENTS (NO CHARGE) OPTIME
TOPICAL | Status: DC | PRN
  Administered 2018-03-29: 1 via TOPICAL

## 2018-03-29 MED ORDER — PHENYLEPHRINE 40 MCG/ML (10ML) SYRINGE FOR IV PUSH (FOR BLOOD PRESSURE SUPPORT)
PREFILLED_SYRINGE | INTRAVENOUS | Status: AC
  Filled 2018-03-29: qty 10

## 2018-03-29 MED ORDER — ROCURONIUM BROMIDE 100 MG/10ML IV SOLN
INTRAVENOUS | Status: DC | PRN
  Administered 2018-03-29: 50 mg via INTRAVENOUS
  Administered 2018-03-29: 10 mg via INTRAVENOUS

## 2018-03-29 MED ORDER — FENTANYL CITRATE (PF) 250 MCG/5ML IJ SOLN
INTRAMUSCULAR | Status: DC | PRN
  Administered 2018-03-29: 100 ug via INTRAVENOUS
  Administered 2018-03-29: 25 ug via INTRAVENOUS
  Administered 2018-03-29: 50 ug via INTRAVENOUS
  Administered 2018-03-29: 25 ug via INTRAVENOUS
  Administered 2018-03-29: 50 ug via INTRAVENOUS

## 2018-03-29 MED ORDER — ONDANSETRON HCL 4 MG/2ML IJ SOLN
INTRAMUSCULAR | Status: AC
  Filled 2018-03-29: qty 2

## 2018-03-29 MED ORDER — SUGAMMADEX SODIUM 200 MG/2ML IV SOLN
INTRAVENOUS | Status: DC | PRN
  Administered 2018-03-29: 230 mg via INTRAVENOUS

## 2018-03-29 MED ORDER — PHENYLEPHRINE HCL 10 MG/ML IJ SOLN
INTRAMUSCULAR | Status: DC | PRN
  Administered 2018-03-29: 60 ug via INTRAVENOUS
  Administered 2018-03-29 (×2): 80 ug via INTRAVENOUS
  Administered 2018-03-29: 100 ug via INTRAVENOUS

## 2018-03-29 MED ORDER — FENTANYL CITRATE (PF) 250 MCG/5ML IJ SOLN
INTRAMUSCULAR | Status: AC
  Filled 2018-03-29: qty 5

## 2018-03-29 MED ORDER — BUPIVACAINE-EPINEPHRINE 0.25% -1:200000 IJ SOLN
INTRAMUSCULAR | Status: DC | PRN
  Administered 2018-03-29: 30 mL

## 2018-03-29 MED ORDER — GI COCKTAIL ~~LOC~~
30.0000 mL | Freq: Three times a day (TID) | ORAL | Status: DC | PRN
  Administered 2018-03-29: 30 mL via ORAL
  Filled 2018-03-29: qty 30

## 2018-03-29 MED ORDER — MORPHINE SULFATE (PF) 2 MG/ML IV SOLN: 1.0000 mg | INTRAVENOUS | Status: DC | PRN

## 2018-03-29 MED ORDER — SODIUM CHLORIDE 0.9 % IR SOLN
Status: DC | PRN
  Administered 2018-03-29: 1000 mL

## 2018-03-29 MED ORDER — PROPOFOL 10 MG/ML IV BOLUS
INTRAVENOUS | Status: DC | PRN
  Administered 2018-03-29: 50 mg via INTRAVENOUS
  Administered 2018-03-29: 150 mg via INTRAVENOUS

## 2018-03-29 MED ORDER — DEXAMETHASONE SODIUM PHOSPHATE 10 MG/ML IJ SOLN
INTRAMUSCULAR | Status: AC
  Filled 2018-03-29: qty 1

## 2018-03-29 MED ORDER — SODIUM CHLORIDE 0.9 % IV SOLN
2.0000 g | INTRAVENOUS | Status: AC
  Administered 2018-03-29: 2 g via INTRAVENOUS
  Filled 2018-03-29: qty 2

## 2018-03-29 MED ORDER — FENTANYL CITRATE (PF) 100 MCG/2ML IJ SOLN: 25.0000 ug | INTRAMUSCULAR | Status: DC | PRN

## 2018-03-29 MED ORDER — PROPOFOL 10 MG/ML IV BOLUS
INTRAVENOUS | Status: AC
  Filled 2018-03-29: qty 20

## 2018-03-29 MED ORDER — GABAPENTIN 100 MG PO CAPS
200.0000 mg | ORAL_CAPSULE | Freq: Two times a day (BID) | ORAL | Status: DC
  Administered 2018-03-30: 200 mg via ORAL
  Filled 2018-03-29 (×2): qty 2

## 2018-03-29 MED ORDER — POTASSIUM CHLORIDE 10 MEQ/100ML IV SOLN
10.0000 meq | INTRAVENOUS | Status: AC
  Administered 2018-03-29 (×3): 10 meq via INTRAVENOUS
  Filled 2018-03-29 (×2): qty 100

## 2018-03-29 MED ORDER — MORPHINE SULFATE (PF) 2 MG/ML IV SOLN: 2.0000 mg | INTRAVENOUS | Status: DC | PRN

## 2018-03-29 MED ORDER — CEFOTETAN DISODIUM-DEXTROSE 2-2.08 GM-%(50ML) IV SOLR
INTRAVENOUS | Status: AC
  Filled 2018-03-29: qty 50

## 2018-03-29 MED ORDER — ROCURONIUM BROMIDE 50 MG/5ML IV SOLN
INTRAVENOUS | Status: AC
  Filled 2018-03-29: qty 1

## 2018-03-29 MED ORDER — HYDROMORPHONE HCL 1 MG/ML IJ SOLN
0.5000 mg | Freq: Once | INTRAMUSCULAR | Status: AC
  Administered 2018-03-29: 0.5 mg via INTRAVENOUS
  Filled 2018-03-29: qty 0.5

## 2018-03-29 MED ORDER — 0.9 % SODIUM CHLORIDE (POUR BTL) OPTIME
TOPICAL | Status: DC | PRN
  Administered 2018-03-29: 1000 mL

## 2018-03-29 MED ORDER — LIDOCAINE HCL (CARDIAC) PF 100 MG/5ML IV SOSY
PREFILLED_SYRINGE | INTRAVENOUS | Status: DC | PRN
  Administered 2018-03-29: 100 mg via INTRAVENOUS

## 2018-03-29 MED ORDER — LIDOCAINE 2% (20 MG/ML) 5 ML SYRINGE
INTRAMUSCULAR | Status: AC
  Filled 2018-03-29: qty 5

## 2018-03-29 MED ORDER — DIPHENHYDRAMINE HCL 50 MG/ML IJ SOLN
INTRAMUSCULAR | Status: AC
  Filled 2018-03-29: qty 1

## 2018-03-29 MED ORDER — LACTATED RINGERS IV SOLN
INTRAVENOUS | Status: DC
  Administered 2018-03-29 (×2): via INTRAVENOUS

## 2018-03-29 MED ORDER — DEXAMETHASONE SODIUM PHOSPHATE 10 MG/ML IJ SOLN
INTRAMUSCULAR | Status: DC | PRN
  Administered 2018-03-29: 10 mg via INTRAVENOUS

## 2018-03-29 MED ORDER — HYDROMORPHONE HCL 1 MG/ML IJ SOLN: 0.5000 mg | INTRAMUSCULAR | Status: DC | PRN

## 2018-03-29 MED ORDER — PNEUMOCOCCAL VAC POLYVALENT 25 MCG/0.5ML IJ INJ
0.5000 mL | INJECTION | INTRAMUSCULAR | Status: AC
  Administered 2018-03-30: 0.5 mL via INTRAMUSCULAR
  Filled 2018-03-29: qty 0.5

## 2018-03-29 MED ORDER — ASPIRIN 325 MG PO TABS
325.0000 mg | ORAL_TABLET | Freq: Every day | ORAL | Status: DC
  Filled 2018-03-29 (×2): qty 1

## 2018-03-29 MED ORDER — BUPIVACAINE-EPINEPHRINE (PF) 0.25% -1:200000 IJ SOLN
INTRAMUSCULAR | Status: AC
Start: 2018-03-29 — End: ?
  Filled 2018-03-29: qty 30

## 2018-03-29 MED ORDER — TRAMADOL HCL 50 MG PO TABS
50.0000 mg | ORAL_TABLET | Freq: Four times a day (QID) | ORAL | Status: DC | PRN
  Administered 2018-03-29 – 2018-03-30 (×2): 50 mg via ORAL
  Filled 2018-03-29 (×2): qty 1

## 2018-03-29 MED ORDER — ENOXAPARIN SODIUM 40 MG/0.4ML ~~LOC~~ SOLN
40.0000 mg | SUBCUTANEOUS | Status: DC
  Administered 2018-03-30: 40 mg via SUBCUTANEOUS
  Filled 2018-03-29: qty 0.4

## 2018-03-29 MED ORDER — ONDANSETRON HCL 4 MG/2ML IJ SOLN
INTRAMUSCULAR | Status: DC | PRN
  Administered 2018-03-29: 4 mg via INTRAVENOUS

## 2018-03-29 MED ORDER — DIPHENHYDRAMINE HCL 50 MG/ML IJ SOLN
INTRAMUSCULAR | Status: DC | PRN
  Administered 2018-03-29: 12.5 mg via INTRAVENOUS

## 2018-03-29 SURGICAL SUPPLY — 42 items
APPLIER CLIP 5 13 M/L LIGAMAX5 (MISCELLANEOUS) ×2
BANDAGE ADH SHEER 1  50/CT (GAUZE/BANDAGES/DRESSINGS) ×8 IMPLANT
BENZOIN TINCTURE PRP APPL 2/3 (GAUZE/BANDAGES/DRESSINGS) ×2 IMPLANT
BLADE CLIPPER SURG (BLADE) ×2 IMPLANT
CANISTER SUCT 3000ML PPV (MISCELLANEOUS) ×2 IMPLANT
CHLORAPREP W/TINT 26ML (MISCELLANEOUS) ×2 IMPLANT
CLIP APPLIE 5 13 M/L LIGAMAX5 (MISCELLANEOUS) ×1 IMPLANT
COVER SURGICAL LIGHT HANDLE (MISCELLANEOUS) ×2 IMPLANT
DRSG TEGADERM 4X4.75 (GAUZE/BANDAGES/DRESSINGS) ×2 IMPLANT
ELECT REM PT RETURN 9FT ADLT (ELECTROSURGICAL) ×2
ELECTRODE REM PT RTRN 9FT ADLT (ELECTROSURGICAL) ×1 IMPLANT
GAUZE SPONGE 2X2 8PLY STRL LF (GAUZE/BANDAGES/DRESSINGS) ×1 IMPLANT
GLOVE BIOGEL M STRL SZ7.5 (GLOVE) ×2 IMPLANT
GLOVE BIOGEL PI IND STRL 8 (GLOVE) ×2 IMPLANT
GLOVE BIOGEL PI INDICATOR 8 (GLOVE) ×2
GOWN STRL REUS W/ TWL LRG LVL3 (GOWN DISPOSABLE) ×2 IMPLANT
GOWN STRL REUS W/TWL 2XL LVL3 (GOWN DISPOSABLE) ×2 IMPLANT
GOWN STRL REUS W/TWL LRG LVL3 (GOWN DISPOSABLE) ×2
GRASPER SUT TROCAR 14GX15 (MISCELLANEOUS) ×2 IMPLANT
HEMOSTAT SNOW SURGICEL 2X4 (HEMOSTASIS) ×2 IMPLANT
KIT BASIN OR (CUSTOM PROCEDURE TRAY) ×2 IMPLANT
KIT TURNOVER KIT B (KITS) ×2 IMPLANT
NS IRRIG 1000ML POUR BTL (IV SOLUTION) ×2 IMPLANT
PAD ARMBOARD 7.5X6 YLW CONV (MISCELLANEOUS) ×2 IMPLANT
POUCH RETRIEVAL ECOSAC 10 (ENDOMECHANICALS) ×1 IMPLANT
POUCH RETRIEVAL ECOSAC 10MM (ENDOMECHANICALS) ×1
SCISSORS LAP 5X35 DISP (ENDOMECHANICALS) ×2 IMPLANT
SET IRRIG TUBING LAPAROSCOPIC (IRRIGATION / IRRIGATOR) ×2 IMPLANT
SLEEVE ENDOPATH XCEL 5M (ENDOMECHANICALS) ×6 IMPLANT
SPECIMEN JAR SMALL (MISCELLANEOUS) ×2 IMPLANT
SPONGE GAUZE 2X2 STER 10/PKG (GAUZE/BANDAGES/DRESSINGS) ×1
STRIP CLOSURE SKIN 1/2X4 (GAUZE/BANDAGES/DRESSINGS) ×2 IMPLANT
SUT MNCRL AB 4-0 PS2 18 (SUTURE) ×4 IMPLANT
SUT VICRYL 0 UR6 27IN ABS (SUTURE) ×2 IMPLANT
TOWEL OR 17X24 6PK STRL BLUE (TOWEL DISPOSABLE) ×2 IMPLANT
TOWEL OR 17X26 10 PK STRL BLUE (TOWEL DISPOSABLE) ×4 IMPLANT
TRAY LAPAROSCOPIC MC (CUSTOM PROCEDURE TRAY) ×2 IMPLANT
TROCAR BLADELESS 12MM (ENDOMECHANICALS) ×2 IMPLANT
TROCAR XCEL BLUNT TIP 100MML (ENDOMECHANICALS) IMPLANT
TROCAR XCEL NON-BLD 5MMX100MML (ENDOMECHANICALS) ×2 IMPLANT
TUBING INSUFFLATION (TUBING) ×6 IMPLANT
WATER STERILE IRR 1000ML POUR (IV SOLUTION) ×2 IMPLANT

## 2018-03-29 NOTE — Op Note (Signed)
Benjamin Stanley [id number] 1941/06/18 03/29/2018  Laparoscopic Cholecystectomy Procedure Note  Indications: This patient presents with symptomatic gallbladder disease and will undergo laparoscopic cholecystectomy.  Pre-operative Diagnosis: chronic cholecystitis  Post-operative Diagnosis: severe chronic cholecystitis - partially necrotic  Surgeon: Greer Pickerel MD FACS  Assistants: Margie Billet PA-C  Anesthesia: General endotracheal anesthesia  Procedure Details  The patient was seen again in the Holding Room. The risks, benefits, complications, treatment options, and expected outcomes were discussed with the patient. The possibilities of reaction to medication, pulmonary aspiration, perforation of viscus, bleeding, recurrent infection, finding a normal gallbladder, the need for additional procedures, failure to diagnose a condition, the possible need to convert to an open procedure, and creating a complication requiring transfusion or operation were discussed with the patient. The likelihood of improving the patient's symptoms with return to their baseline status is good.  The patient and/or family concurred with the proposed plan, giving informed consent. The site of surgery properly noted. The patient was taken to Operating Room, identified as Benjamin Stanley and the procedure verified as Laparoscopic Cholecystectomy. A Time Out was held and the above information confirmed. Antibiotic prophylaxis was administered.   Prior to the induction of general anesthesia, antibiotic prophylaxis was administered. General endotracheal anesthesia was then administered and tolerated well. After the induction, the abdomen was prepped with Chloraprep and draped in the sterile fashion. The patient was positioned in the supine position.  He had prior lower midline incision for an emergent sigmoid colectomy a few years ago so I decided to access his abdomen using Optiview technique.  A small incision was made in  the right upper quadrant 2 fingerbreadths below the right subcostal margin in the midclavicular line.  Then using a 0 degree 5 mm laparoscope through a 5 mm trocar I advanced it through all layers of the abdominal wall until entering the abdominal cavity.  Pneumoperitoneum was smoothly established without any change in vital signs up to a patient pressure of 15 mmHg.  The laparoscope was advanced and the abdominal cavity was surveilled.  There is no evidence of injury to surrounding structures.  There were omental adhesions around the umbilicus extending in the upper midline.  There were no bowel adhesions in this location.  I therefore went ahead and placed the right lateral upper abdominal trocar under direct visualization.  Then using EndoShears with and without electrocautery I took down some the omental adhesions around the midline just above the umbilicus.  Once this was done we placed a 5 mm trocar in the supraumbilical position under direct visualization.  The patient had been previously placed in reverse Trendelenburg and rotated to the left.  I then placed a 11 mm trocar in the subxiphoid position under direct visualization.    The gallbladder was identified.  It was edematous and thick-walled and appeared partially necrotic.  I aspirated the gallbladder in order to facilitate retraction.  The fundus grasped and retracted cephalad. Adhesions were lysed bluntly and with the electrocautery where indicated, taking care not to injure any adjacent organs or viscus.  There was edema and the gallbladder wall.  In order to facilitate exposure of the infundibulum I ended up placing an additional 5 mm trocar in the right lower mid abdomen under direct visualization and my assistant used a atraumatic laparoscopic instrument to push down on the omentum and transverse colon so I could use my second hand to grab the infundibulum and retract it.  The infundibulum was grasped and retracted laterally, exposing the  peritoneum  overlying the triangle of Calot. This was then divided and exposed in a blunt fashion. A critical view of the cystic duct and cystic artery was obtained.  The cystic duct was clearly identified and bluntly dissected circumferentially.   The cystic duct was then ligated with clips and divided. The cystic artery which had been identified & dissected free was ligated with clips and divided as well.   The gallbladder was dissected from the liver bed in retrograde fashion with the electrocautery.  The posterior wall of the gallbladder was densely inflamed and adhered to the liver capsule.  There was some bleeding from the gallbladder fossa.  Hemostasis was achieved with electrocautery.  The right upper quadrant and gallbladder fossa was irrigated with 1 L saline and hemostasis was ultimately achieved with electrocautery.  The gallbladder was removed and placed in an Ecco sac.  The gallbladder and Ecco sac were then removed through the subxiphoid port site after enlarging the skin incision.  A PMI suture passer with 0 Vicryl was used to close the fascial defect in the subxiphoid position with 2 interrupted 0 Vicryls using laparoscopic assistance.  We again inspected the right upper quadrant for hemostasis.  The subxiphoid closure was inspected and there was no air leak and nothing trapped within the closure. Pneumoperitoneum was released as we removed the trocars.  4-0 Monocryl was used to close the skin.   Benzoin, steri-strips, and clean dressings were applied. The patient was then extubated and brought to the recovery room in stable condition. Instrument, sponge, and needle counts were correct at closure and at the conclusion of the case.   Findings: Severe partially necrotic Cholecystitis   Estimated Blood Loss: less than 50 mL         Drains: none         Specimens: Gallbladder           Complications: None; patient tolerated the procedure well.         Disposition: PACU -  hemodynamically stable.         Condition: stable; discussed intraoperative findings with patient's daughter via phone  Leighton Ruff. Redmond Pulling, MD, FACS General, Bariatric, & Minimally Invasive Surgery [facility], [place]

## 2018-03-29 NOTE — Progress Notes (Signed)
Patient returned to 2c11 from pacu. Vss. 5 sites to abdomen all clean dry and intact. Pt complaining of 5/10 pain. See mar. Will continue to monitor.

## 2018-03-29 NOTE — Anesthesia Preprocedure Evaluation (Addendum)
Anesthesia Evaluation  Patient identified by MRN, date of birth, ID band Patient awake    Reviewed: Allergy & Precautions, NPO status , Patient's Chart, lab work & pertinent test results  Airway Mallampati: II  TM Distance: >3 FB Neck ROM: Full    Dental no notable dental hx. (+) Dental Advisory Given   Pulmonary neg pulmonary ROS,    Pulmonary exam normal        Cardiovascular hypertension,  Rhythm:Regular Rate:Normal + Systolic murmurs    Neuro/Psych negative neurological ROS  negative psych ROS   GI/Hepatic Neg liver ROS, PUD,   Endo/Other  Morbid obesity  Renal/GU negative Renal ROS     Musculoskeletal   Abdominal   Peds  Hematology   Anesthesia Other Findings   Reproductive/Obstetrics                           Anesthesia Physical Anesthesia Plan  ASA: III and emergent  Anesthesia Plan: General   Post-op Pain Management:    Induction: Intravenous and Inhalational  PONV Risk Score and Plan: 3 and Ondansetron, Dexamethasone and Diphenhydramine  Airway Management Planned: Oral ETT  Additional Equipment:   Intra-op Plan:   Post-operative Plan: Extubation in OR  Informed Consent: I have reviewed the patients History and Physical, chart, labs and discussed the procedure including the risks, benefits and alternatives for the proposed anesthesia with the patient or authorized representative who has indicated his/her understanding and acceptance.   Dental advisory given  Plan Discussed with: CRNA, Anesthesiologist and Surgeon  Anesthesia Plan Comments:       Anesthesia Quick Evaluation

## 2018-03-29 NOTE — Addendum Note (Signed)
Addendum  created 03/29/18 1716 by Raenette Rover, CRNA   Charge Capture section accepted

## 2018-03-29 NOTE — Transfer of Care (Signed)
Immediate Anesthesia Transfer of Care Note  Patient: Benjamin Stanley  Procedure(s) Performed: LAPAROSCOPIC CHOLECYSTECTOMY (N/A Abdomen)  Patient Location: PACU  Anesthesia Type:General  Level of Consciousness: awake, alert , oriented, drowsy and patient cooperative  Airway & Oxygen Therapy: Patient Spontanous Breathing and Patient connected to nasal cannula oxygen  Post-op Assessment: Report given to RN and Post -op Vital signs reviewed and stable  Post vital signs: Reviewed and stable  Last Vitals:  Vitals Value Taken Time  BP 115/65 03/29/2018  3:42 PM  Temp    Pulse 84 03/29/2018  3:45 PM  Resp 17 03/29/2018  3:45 PM  SpO2 97 % 03/29/2018  3:45 PM  Vitals shown include unvalidated device data.  Last Pain:  Vitals:   03/29/18 1300  TempSrc: Oral  PainSc:          Complications: No apparent anesthesia complications

## 2018-03-29 NOTE — Consult Note (Signed)
Benjamin Stanley 1941-01-03  [id number].    Requesting MD: Dr. Dessie Coma Chief Complaint/Reason for Consult: abdominal pain  HPI:  This is a 77yo male with a history of HTN, hyperlipidemia, and colon cancer who began having some epigastric and substernal burning pain about a month ago.  He states it usually happens after larger meals, but not so much after small meals.  He does not eat fried or fatty foods.  He denies every having nausea related to this pain or diarrhea.  He denies any blood in his stool.  He denies every having fevers.  He states that sometimes eating helps his pain, but not always.  Because of intermittent symptoms, he saw an MD that recommended omeprazole.  He has taken this for a couple of weeks with really no help.    Sunday, he developed the worst episode so far with substernal chest pain and epigastric burning.  Nothing helped this.  He was afraid that because this was in his chest that he needed to come have his heart evaluated.  EMS brought him in.  His heart has been evaluated as well as a CTA of the chest which were all negative.  He then had an US of the abdomen which revealed no gallstones, but with some gb wall thickening at 78m and a small amount of pericholecystic fluid.  A HIDA was then ordered which was incomplete as he could not receive morphine, but did not show any evidence of gallbladder uptake.  We have been asked to see him for further evaluation.   ROS: ROS : Please see HPI, otherwise all other symptoms are negative.  History reviewed. No pertinent family history.  Past Medical History:  Diagnosis Date  . Colon cancer ([place]    s/p resection, stage I, recent colonoscopy negative 4 months ago  . Gastric ulcer   . Hyperlipidemia   . Hypertension     Past Surgical History:  Procedure Laterality Date  . EXPLORATORY LAPAROTOMY W/ BOWEL RESECTION     sigmoid colectomy for colon cancer and colon perf after colonoscopy  . KNEE ARTHROSCOPY     several prior knee scopes in the remote past  . NASAL SINUS SURGERY     remote 20+yrs ago  . NO PAST SURGERIES    . REPLACEMENT TOTAL KNEE BILATERAL      Social History:  reports that he has never smoked. He has never used smokeless tobacco. He reports that he drinks about 1.8 oz of alcohol per week. He reports that he does not use drugs.  Allergies:  Allergies  Allergen Reactions  . Levaquin [Levofloxacin In D5w] Other (See Comments)    Joint pain and upset stomach   . Morphine And Related Nausea And Vomiting  . Promethazine Anxiety  . Sulfa Antibiotics Rash    Medications Prior to Admission  Medication Sig Dispense Refill  . atorvastatin (LIPITOR) 80 MG tablet Take 80 mg by mouth daily.    . cetirizine (ZYRTEC) 10 MG tablet Take 10 mg by mouth daily.    .Marland Kitchenlisinopril-hydrochlorothiazide (PRINZIDE,ZESTORETIC) 20-12.5 MG tablet Take 1 tablet by mouth daily.    . metoprolol succinate (TOPROL-XL) 100 MG 24 hr tablet Take 100 mg by mouth daily.    .Marland Kitchenomeprazole (PRILOSEC) 40 MG capsule Take 40 mg by mouth daily.    . sertraline (ZOLOFT) 50 MG tablet Take 50 mg by mouth daily.    . tamsulosin (FLOMAX) 0.4 MG CAPS capsule Take 0.4 mg by  mouth daily.       Physical Exam: Blood pressure 129/74, pulse 72, temperature 98.7 F (37.1 C), temperature source Oral, resp. rate (!) 25, height 5' 10"  (1.778 m), weight 114.4 kg (252 lb 1.6 oz), SpO2 92 %. General: pleasant, WD, WN, obese white male who is sitting in his chair in NAD HEENT: head is normocephalic, atraumatic.  Sclera are noninjected.  PERRL.  Ears and nose without any masses or lesions.  Mouth is pink and moist Heart: regular, rate, and rhythm.  Normal s1,s2. No obvious murmurs, gallops, or rubs noted.  Palpable radial and pedal pulses bilaterally Lungs: CTAB, no wheezes, rhonchi, or rales noted.  Respiratory effort nonlabored Abd: soft, mild RUQ tenderness with some minimal guarding to palpation, ND, rotund, +BS, no masses,  hernias, or organomegaly MS: all 4 extremities are symmetrical with no cyanosis, clubbing. Trace lower extremity edema Skin: warm and dry with no masses, lesions, or rashes Psych: A&Ox3 with an appropriate affect.   Results for orders placed or performed during the hospital encounter of 03/28/18 (from the past 48 hour(s))  CBC with Differential/Platelet     Status: None   Collection Time: 03/28/18  1:09 AM  Result Value Ref Range   WBC 8.9 4.0 - 10.5 K/uL   RBC 4.52 4.22 - 5.81 MIL/uL   Hemoglobin 14.0 13.0 - 17.0 g/dL   HCT 41.3 39.0 - 52.0 %   MCV 91.4 78.0 - 100.0 fL   MCH 31.0 26.0 - 34.0 pg   MCHC 33.9 30.0 - 36.0 g/dL   RDW 13.4 11.5 - 15.5 %   Platelets 223 150 - 400 K/uL   Neutrophils Relative % 56 %   Neutro Abs 4.9 1.7 - 7.7 K/uL   Lymphocytes Relative 32 %   Lymphs Abs 2.9 0.7 - 4.0 K/uL   Monocytes Relative 8 %   Monocytes Absolute 0.7 0.1 - 1.0 K/uL   Eosinophils Relative 4 %   Eosinophils Absolute 0.4 0.0 - 0.7 K/uL   Basophils Relative 0 %   Basophils Absolute 0.0 0.0 - 0.1 K/uL    Comment: Performed at [facility], [address] [place], [place] [address]  Hepatic function panel     Status: Abnormal   Collection Time: 03/28/18  1:09 AM  Result Value Ref Range   Total Protein 6.6 6.5 - 8.1 g/dL   Albumin 3.6 3.5 - 5.0 g/dL   AST 18 15 - 41 U/L   ALT 13 (L) 17 - 63 U/L   Alkaline Phosphatase 75 38 - 126 U/L   Total Bilirubin 1.1 0.3 - 1.2 mg/dL   Bilirubin, Direct 0.3 0.1 - 0.5 mg/dL   Indirect Bilirubin 0.8 0.3 - 0.9 mg/dL    Comment: Performed at [facility] [address] [place], [place] [address]  Lipase, blood     Status: None   Collection Time: 03/28/18  1:09 AM  Result Value Ref Range   Lipase 50 11 - 51 U/L    Comment: Performed at [facility] [address] [place],  [address]  I-stat troponin, ED     Status: None   Collection Time: 03/28/18  1:12 AM  Result Value Ref Range   Troponin i, poc 0.01 0.00 - 0.08  ng/mL   Comment 3            Comment: Due to the release kinetics of cTnI, a negative result within the first hours of the onset of symptoms  does not rule out myocardial infarction with certainty. If myocardial infarction is still suspected, repeat the test at appropriate intervals.   I-Stat Chem 8, ED     Status: Abnormal   Collection Time: 03/28/18  1:15 AM  Result Value Ref Range   Sodium 133 (L) 135 - 145 mmol/L   Potassium 4.0 3.5 - 5.1 mmol/L   Chloride 98 (L) 101 - 111 mmol/L   BUN 19 6 - 20 mg/dL   Creatinine, Ser 1.20 0.61 - 1.24 mg/dL   Glucose, Bld 106 (H) 65 - 99 mg/dL   Calcium, Ion 1.10 (L) 1.15 - 1.40 mmol/L   TCO2 27 22 - 32 mmol/L   Hemoglobin 13.6 13.0 - 17.0 g/dL   HCT 40.0 39.0 - 52.0 %  Troponin I (q 6hr x 3)     Status: None   Collection Time: 03/28/18  5:52 AM  Result Value Ref Range   Troponin I <0.03 <0.03 ng/mL    Comment: Performed at [facility], [address] [place], [place] [address]  Lipid panel     Status: Abnormal   Collection Time: 03/28/18  9:07 AM  Result Value Ref Range   Cholesterol 101 0 - 200 mg/dL   Triglycerides 78 <150 mg/dL   HDL 38 (L) >40 mg/dL   Total CHOL/HDL Ratio 2.7 RATIO   VLDL 16 0 - 40 mg/dL   LDL Cholesterol 47 0 - 99 mg/dL    Comment:        Total Cholesterol/HDL:CHD Risk Coronary Heart Disease Risk Table                     Men   Women  1/2 Average Risk   3.4   3.3  Average Risk       5.0   4.4  2 X Average Risk   9.6   7.1  3 X Average Risk  23.4   11.0        Use the calculated Patient Ratio above and the CHD Risk Table to determine the patient's CHD Risk.        ATP III CLASSIFICATION (LDL):  <100     mg/dL   Optimal  100-129  mg/dL   Near or Above                    Optimal  130-159  mg/dL   Borderline  160-189  mg/dL   High  >190     mg/dL   Very High Performed at [facility] [address] [place], [place] [address]   Hemoglobin A1c     Status: None   Collection Time: 03/28/18   9:07 AM  Result Value Ref Range   Hgb A1c MFr Bld 5.4 4.8 - 5.6 %    Comment: (NOTE) Pre diabetes:          5.7%-6.4% Diabetes:              >6.4% Glycemic control for   <7.0% adults with diabetes    Mean Plasma Glucose 108.28 mg/dL    Comment: Performed at [facility] [address], [place], [place] [address]  Troponin I (q 6hr x 3)     Status: None   Collection Time: 03/28/18  9:45 AM  Result Value Ref Range   Troponin I <0.03 <0.03 ng/mL    Comment: Performed at [facility] [address], [place], [place] [address]  MRSA PCR Screening     Status: None   Collection Time: 03/28/18  6:32 PM  Result Value Ref Range   MRSA by PCR NEGATIVE NEGATIVE    Comment:        The GeneXpert MRSA Assay (FDA approved for NASAL specimens only), is one component of a comprehensive MRSA colonization surveillance program. It is not intended to diagnose MRSA infection nor to guide or monitor treatment for MRSA infections. Performed at [facility], [place] [address], [place], [place] [address]   Troponin I (q 6hr x 3)     Status: None   Collection Time: 03/28/18  6:36 PM  Result Value Ref Range   Troponin I <0.03 <0.03 ng/mL    Comment: Performed at [facility] [address], [place], [place] [address]  CBC     Status: Abnormal   Collection Time: 03/29/18  3:12 AM  Result Value Ref Range   WBC 8.6 4.0 - 10.5 K/uL   RBC 4.03 (L) 4.22 - 5.81 MIL/uL   Hemoglobin 12.2 (L) 13.0 - 17.0 g/dL   HCT 36.5 (L) 39.0 - 52.0 %   MCV 90.6 78.0 - 100.0 fL   MCH 30.3 26.0 - 34.0 pg   MCHC 33.4 30.0 - 36.0 g/dL   RDW 13.0 11.5 - 15.5 %   Platelets 179 150 - 400 K/uL    Comment: Performed at [facility], [place] [address] [place], [place] [address]  Basic metabolic panel     Status: Abnormal   Collection Time: 03/29/18  3:12 AM  Result Value Ref Range   Sodium 136 135 - 145 mmol/L   Potassium 3.4 (L) 3.5 - 5.1 mmol/L   Chloride 105 101 - 111 mmol/L   CO2 25 22 - 32  mmol/L   Glucose, Bld 125 (H) 65 - 99 mg/dL   BUN 10 6 - 20 mg/dL   Creatinine, Ser 1.03 0.61 - 1.24 mg/dL   Calcium 7.9 (L) 8.9 - 10.3 mg/dL   GFR calc non Af Amer >60 >60 mL/min   GFR calc Af Amer >60 >60 mL/min    Comment: (NOTE) The eGFR has been calculated using the CKD EPI equation. This calculation has not been validated in all clinical situations. eGFR's persistently <60 mL/min signify possible Chronic Kidney Disease.    Anion gap 6 5 - 15    Comment: Performed at [facility] [address] [place], [place] [address]   Nm Hepatobiliary Liver Func  Result Date: 03/28/2018 CLINICAL DATA:  Right upper quadrant pain. EXAM: NUCLEAR MEDICINE HEPATOBILIARY IMAGING TECHNIQUE: Sequential images of the abdomen were obtained out to 60 minutes following intravenous administration of radiopharmaceutical. RADIOPHARMACEUTICALS:  5.1 mCi Tc-93m Choletec IV COMPARISON:  None. FINDINGS: The study is somewhat limited as the patient could not tolerate morphine. Additionally, a lateral view was not originally obtained. Subsequent lateral imaging at 4 hours was then obtained. No filling of the gallbladder in the first hour of imaging. At 2 hours, there was no definitive filling of the gallbladder. However, activity just to the right of midline along the inferior aspect of the liver was not definitive. As a result, delayed 4 hour imaging was obtained. No clear evidence of gallbladder filling is identified. IMPRESSION: Study is limited as above. However, there is no convincing evidence of gallbladder filling. Lack of gallbladder filling can be seen in the setting of acute cholecystitis, prolonged fasting, recent morphine, critical illness, and chronic cholecystitis.  Recommend clinical correlation. Electronically Signed   By: Dorise Bullion III M.D   On: 03/28/2018 20:40   Dg Chest Portable 1 View  Result Date: 03/28/2018 CLINICAL DATA:  Chest pain x1 day EXAM: PORTABLE CHEST 1 VIEW COMPARISON:  None.  FINDINGS: Borderline cardiomegaly with aortic atherosclerosis. Clear lungs without effusion or pulmonary edema. No pneumothorax. No pulmonary consolidation. No acute nor suspicious osseous abnormality. Osteoarthritis of the [facility] and glenohumeral joints bilaterally. IMPRESSION: No active disease.  Aortic atherosclerosis. Electronically Signed   By: Ashley Royalty M.D.   On: 03/28/2018 01:52   Ct Angio Chest/abd/pel For Dissection W And/or Wo Contrast  Result Date: 03/28/2018 CLINICAL DATA:  77 year old male with chest pain radiating to the back. EXAM: CT ANGIOGRAPHY CHEST, ABDOMEN AND PELVIS TECHNIQUE: Multidetector CT imaging through the chest, abdomen and pelvis was performed using the standard protocol during bolus administration of intravenous contrast. Multiplanar reconstructed images and MIPs were obtained and reviewed to evaluate the vascular anatomy. CONTRAST:  166m ISOVUE-370 IOPAMIDOL (ISOVUE-370) INJECTION 76% COMPARISON:  Chest radiograph dated 03/28/2018 FINDINGS: CTA CHEST FINDINGS Cardiovascular: There is no cardiomegaly or pericardial effusion. There is coronary vascular calcification. There is mild atherosclerotic calcification of the thoracic aorta. No aneurysmal dilatation or evidence of dissection. The origins of the great vessels of the aortic arch appear patent. There is no CT evidence of pulmonary embolism. Mediastinum/Nodes: No hilar or mediastinal adenopathy. Esophagus and the thyroid gland are grossly unremarkable. No mediastinal fluid collection. Lungs/Pleura: Minimal bibasilar linear atelectasis/scarring. There is no focal consolidation, pleural effusion, or pneumothorax. The central airways are patent. Musculoskeletal: There is degenerative changes of the spine. No acute osseous pathology. Review of the MIP images confirms the above findings. CTA ABDOMEN AND PELVIS FINDINGS VASCULAR Aorta: Mild atherosclerotic calcification. No aneurysmal dilatation or dissection. Celiac: Patent without  evidence of aneurysm, dissection, vasculitis or significant stenosis. SMA: Mild noncalcified plaque in the origin of the SMA with minimal luminal narrowing. The SMA is patent. Renals: Atherosclerotic calcification of the renal ostia. The renal arteries are patent. IMA: Patent without evidence of aneurysm, dissection, vasculitis or significant stenosis. Inflow: Mild atherosclerotic calcification. No aneurysm or dissection. The iliac arteries are patent. Veins: No obvious venous abnormality within the limitations of this arterial phase study. Review of the MIP images confirms the above findings. NON-VASCULAR No intra-abdominal free air or free fluid. Hepatobiliary: No focal liver abnormality is seen. No gallstones, gallbladder wall thickening, or biliary dilatation. Pancreas: Unremarkable. No pancreatic ductal dilatation or surrounding inflammatory changes. Spleen: Normal in size without focal abnormality. Adrenals/Urinary Tract: A 13 mm right adrenal adenoma. The left adrenal gland is unremarkable. Bilateral renal cysts as well as smaller hypodensities which are not characterized. There is no hydronephrosis on either side. The visualized ureters and urinary bladder appear unremarkable. Stomach/Bowel: Small hiatal hernia. There is postsurgical changes of the bowel. No bowel obstruction or active inflammation. There is colonic diverticulosis as well as a 15 mm duodenal diverticulum without active inflammatory changes. The appendix is normal. Lymphatic: No adenopathy. Reproductive: The prostate is mildly enlarged measuring approximately 7 cm in transverse axial diameter. Other: Midline vertical anterior abdominal wall incisional scar. Small fat containing umbilical hernia. Musculoskeletal: Multilevel degenerative changes of the spine. No acute osseous pathology. Indeterminate 13 mm sclerotic focus in the L3 vertebra, possibly a bone island. Review of the MIP images confirms the above findings. IMPRESSION: 1. No acute  intrathoracic, abdominal, or pelvic pathology. No aortic aneurysm or dissection. No CT evidence of pulmonary embolism. 2. Coronary  vascular calcification, and Aortic Atherosclerosis (ICD10-I70.0). 3. Diverticulosis of the colon and duodenum. No bowel obstruction or active inflammation. Normal appendix. Electronically Signed   By: Anner Crete M.D.   On: 03/28/2018 04:08   US Abdomen Limited Ruq  Result Date: 03/28/2018 CLINICAL DATA:  Chest pain last night EXAM: ULTRASOUND ABDOMEN LIMITED RIGHT UPPER QUADRANT COMPARISON:  None. FINDINGS: Gallbladder: Gallbladder wall thickening to 6 mm. No gallbladder distension. Small amount pericholecystic fluid. No echogenic gallstones present. Negative sonographic Murphy's sign. Common bile duct: Diameter: Normal 4 mm Liver: No focal lesion identified. Within normal limits in parenchymal echogenicity. Portal vein is patent on color Doppler imaging with normal direction of blood flow towards the liver. IMPRESSION: 1. Thickened gallbladder wall without gallbladder distention or gallstones. Negative sonographic Murphy's sign. 2. No biliary duct dilatation Electronically Signed   By: Suzy Bouchard M.D.   On: 03/28/2018 09:59      Assessment/Plan Epigastric abdominal pain, possible chronic acalculous cholecystitis The patient's story is not straightforward for his gallbladder or for ulcer disease.  However, some of his symptoms are c/w chronic cholecystitis and given his [place] and HIDA scan this is most likely what is going on.  It is uncommon to have this finding without stones, but not unheard of.  Dr. Redmond Pulling is going to evaluate the patient, but will likely offer a cholecystectomy.  He does not appear to have acute cholecystitis as he is afebrile, normal WBC, and normal LFTs.  He could have some ulcer disease as well.  He has never had an endoscopy, but once again, given the findings we have, his gallbladder is felt to be more contributory than possible ulcer disease  acutely.   FEN - NPO, possible OR today VTE - Lovenox this am ID - none currently  Henreitta Cea, [facility] Surgery 03/29/2018, 11:57 AM Pager: [phone]

## 2018-03-29 NOTE — Anesthesia Procedure Notes (Signed)
Procedure Name: Intubation Date/Time: 03/29/2018 2:07 PM Performed by: Raenette Rover, CRNA Pre-anesthesia Checklist: Patient identified, Emergency Drugs available, Suction available and Patient being monitored Patient Re-evaluated:Patient Re-evaluated prior to induction Oxygen Delivery Method: Circle system utilized Preoxygenation: Pre-oxygenation with 100% oxygen Induction Type: IV induction Ventilation: Mask ventilation without difficulty Laryngoscope Size: Mac and 4 Grade View: Grade II Tube type: Oral Tube size: 7.5 mm Number of attempts: 1 Airway Equipment and Method: Stylet Placement Confirmation: ETT inserted through vocal cords under direct vision,  positive ETCO2,  CO2 detector and breath sounds checked- equal and bilateral Secured at: 23 cm Tube secured with: Tape Dental Injury: Teeth and Oropharynx as per pre-operative assessment

## 2018-03-29 NOTE — Discharge Instructions (Signed)

## 2018-03-29 NOTE — Anesthesia Postprocedure Evaluation (Signed)
Anesthesia Post Note  Patient: Benjamin Stanley  Procedure(s) Performed: LAPAROSCOPIC CHOLECYSTECTOMY (N/A Abdomen)     Patient location during evaluation: PACU Anesthesia Type: General Level of consciousness: sedated Pain management: pain level controlled Vital Signs Assessment: post-procedure vital signs reviewed and stable Respiratory status: spontaneous breathing and respiratory function stable Cardiovascular status: stable Postop Assessment: no apparent nausea or vomiting Anesthetic complications: no    Last Vitals:  Vitals:   03/29/18 1545 03/29/18 1615  BP:  104/62  Pulse:  83  Resp:  18  Temp: 36.5 C   SpO2:  97%    Last Pain:  Vitals:   03/29/18 1615  TempSrc:   PainSc: Asleep                 Dillen Belmontes DANIEL

## 2018-03-29 NOTE — Progress Notes (Signed)
PROGRESS NOTE    Benjamin Stanley  VFI:[id number] DOB: 1941-11-20 DOA: 03/28/2018 PCP: Patient, No Pcp Per   Brief Narrative: Patient is a 77 year old male with past medical history of hypertension, hyperlipidemia,PUD who presented to the emergency department with complaints of substernal chest pain, diaphoresis, right-sided upper abdominal pain daily.  Patient was admitted to rule out ACS.  His chest pain has resolved.  Troponins remain negative.  HIDA scan came out out out be abnormal.  Surgery consulted and he is being planned for cholecystectomy today.  Assessment & Plan:   Principal Problem:   Chest pain Active Problems:   Hypertension   History of gastric ulcer   Hyperlipidemia  Abdominal pain/abnormal HIDA scan: Surgery suspecting gallbladder disease.  No gallbladder stones visualized.  Could not rule out cholecystitis. Abnormal HIDA scan.US gallbladder showed thickened gallbladder wall without gallbladder distention or gallstones Plan for lap  cholecystectomy today. Patient also has history of PUD.  Chest pain: Resolved.  EKG did not show any ischemic changes.  Troponin is negative.  Hypertension: Currently normotensive.  Home antihypertensives on hold.  Hyperlipidemia: Continue statin   DVT prophylaxis: Lovenox Code Status: Full Family Communication: None present at the bedside Disposition Plan: Likely home in 1 to 2 days after surgery   Consultants: general surgery  Procedures: None  Antimicrobials:None  Subjective: Patient seen and examined the bedside this morning.  Remains comfortable.  Denies any chest pain.  There is some mild right upper abdominal quadrant tenderness.  No nausea vomiting or fever.  Objective: Vitals:   03/28/18 2335 03/29/18 0305 03/29/18 0703 03/29/18 1300  BP: (!) 103/59 109/67 129/74 139/73  Pulse:  64 72   Resp:  17 (!) 25   Temp:  98.4 F (36.9 C) 98.7 F (37.1 C) 98.4 F (36.9 C)  TempSrc:  Oral Oral Oral  SpO2: 92% 96% 92%    Weight:      Height:        Intake/Output Summary (Last 24 hours) at 03/29/2018 1321 Last data filed at 03/29/2018 0900 Gross per 24 hour  Intake 1970 ml  Output 500 ml  Net 1470 ml   Filed Weights   03/28/18 1800  Weight: 114.4 kg (252 lb 1.6 oz)    Examination:  General exam: Appears calm and comfortable ,Not in distress,average built HEENT:PERRL,Oral mucosa moist, Ear/Nose normal on gross exam Respiratory system: Bilateral equal air entry, normal vesicular breath sounds, no wheezes or crackles  Cardiovascular system: S1 & S2 heard, RRR. No JVD, murmurs, rubs, gallops or clicks. No pedal edema. Gastrointestinal system: Abdomen is nondistended, soft . No organomegaly or masses felt. Normal bowel sounds heard. Right upper quadrant tenderness Central nervous system: Alert and oriented. No focal neurological deficits. Extremities: No edema, no clubbing ,no cyanosis, distal peripheral pulses palpable. Skin: No rashes, lesions or ulcers,no icterus ,no pallor MSK: Normal muscle bulk,tone ,power Psychiatry: Judgement and insight appear normal. Mood & affect appropriate.     Data Reviewed: I have personally reviewed following labs and imaging studies  CBC: Recent Labs  Lab 03/28/18 0109 03/28/18 0115 03/29/18 0312  WBC 8.9  --  8.6  NEUTROABS 4.9  --   --   HGB 14.0 13.6 12.2*  HCT 41.3 40.0 36.5*  MCV 91.4  --  90.6  PLT 223  --  416   Basic Metabolic Panel: Recent Labs  Lab 03/28/18 0115 03/29/18 0312  NA 133* 136  K 4.0 3.4*  CL 98* 105  CO2  --  25  GLUCOSE 106* 125*  BUN 19 10  CREATININE 1.20 1.03  CALCIUM  --  7.9*   GFR: Estimated Creatinine Clearance: 73.7 mL/min (by C-G formula based on SCr of 1.03 mg/dL). Liver Function Tests: Recent Labs  Lab 03/28/18 0109  AST 18  ALT 13*  ALKPHOS 75  BILITOT 1.1  PROT 6.6  ALBUMIN 3.6   Recent Labs  Lab 03/28/18 0109  LIPASE 50   No results for input(s): AMMONIA in the last 168 hours. Coagulation  Profile: No results for input(s): INR, PROTIME in the last 168 hours. Cardiac Enzymes: Recent Labs  Lab 03/28/18 0552 03/28/18 0945 03/28/18 1836  TROPONINI <0.03 <0.03 <0.03   BNP (last 3 results) No results for input(s): PROBNP in the last 8760 hours. HbA1C: Recent Labs    03/28/18 0907  HGBA1C 5.4   CBG: No results for input(s): GLUCAP in the last 168 hours. Lipid Profile: Recent Labs    03/28/18 0907  CHOL 101  HDL 38*  LDLCALC 47  TRIG 78  CHOLHDL 2.7   Thyroid Function Tests: No results for input(s): TSH, T4TOTAL, FREET4, T3FREE, THYROIDAB in the last 72 hours. Anemia Panel: No results for input(s): VITAMINB12, FOLATE, FERRITIN, TIBC, IRON, RETICCTPCT in the last 72 hours. Sepsis Labs: No results for input(s): PROCALCITON, LATICACIDVEN in the last 168 hours.  Recent Results (from the past 240 hour(s))  MRSA PCR Screening     Status: None   Collection Time: 03/28/18  6:32 PM  Result Value Ref Range Status   MRSA by PCR NEGATIVE NEGATIVE Final    Comment:        The GeneXpert MRSA Assay (FDA approved for NASAL specimens only), is one component of a comprehensive MRSA colonization surveillance program. It is not intended to diagnose MRSA infection nor to guide or monitor treatment for MRSA infections. Performed at [facility], [place] [address] [place], [place] [address]          Radiology Studies: Nm Hepatobiliary Liver Func  Result Date: 03/28/2018 CLINICAL DATA:  Right upper quadrant pain. EXAM: NUCLEAR MEDICINE HEPATOBILIARY IMAGING TECHNIQUE: Sequential images of the abdomen were obtained out to 60 minutes following intravenous administration of radiopharmaceutical. RADIOPHARMACEUTICALS:  5.1 mCi Tc-110m  Choletec IV COMPARISON:  None. FINDINGS: The study is somewhat limited as the patient could not tolerate morphine. Additionally, a lateral view was not originally obtained. Subsequent lateral imaging at 4 hours was then obtained. No filling  of the gallbladder in the first hour of imaging. At 2 hours, there was no definitive filling of the gallbladder. However, activity just to the right of midline along the inferior aspect of the liver was not definitive. As a result, delayed 4 hour imaging was obtained. No clear evidence of gallbladder filling is identified. IMPRESSION: Study is limited as above. However, there is no convincing evidence of gallbladder filling. Lack of gallbladder filling can be seen in the setting of acute cholecystitis, prolonged fasting, recent morphine, critical illness, and chronic cholecystitis. Recommend clinical correlation. Electronically Signed   By: Dorise Bullion III M.D   On: 03/28/2018 20:40   Dg Chest Portable 1 View  Result Date: 03/28/2018 CLINICAL DATA:  Chest pain x1 day EXAM: PORTABLE CHEST 1 VIEW COMPARISON:  None. FINDINGS: Borderline cardiomegaly with aortic atherosclerosis. Clear lungs without effusion or pulmonary edema. No pneumothorax. No pulmonary consolidation. No acute nor suspicious osseous abnormality. Osteoarthritis of the [facility] and glenohumeral joints bilaterally. IMPRESSION: No active disease.  Aortic atherosclerosis. Electronically Signed  By: Ashley Royalty M.D.   On: 03/28/2018 01:52   Ct Angio Chest/abd/pel For Dissection W And/or Wo Contrast  Result Date: 03/28/2018 CLINICAL DATA:  77 year old male with chest pain radiating to the back. EXAM: CT ANGIOGRAPHY CHEST, ABDOMEN AND PELVIS TECHNIQUE: Multidetector CT imaging through the chest, abdomen and pelvis was performed using the standard protocol during bolus administration of intravenous contrast. Multiplanar reconstructed images and MIPs were obtained and reviewed to evaluate the vascular anatomy. CONTRAST:  175mL ISOVUE-370 IOPAMIDOL (ISOVUE-370) INJECTION 76% COMPARISON:  Chest radiograph dated 03/28/2018 FINDINGS: CTA CHEST FINDINGS Cardiovascular: There is no cardiomegaly or pericardial effusion. There is coronary vascular calcification.  There is mild atherosclerotic calcification of the thoracic aorta. No aneurysmal dilatation or evidence of dissection. The origins of the great vessels of the aortic arch appear patent. There is no CT evidence of pulmonary embolism. Mediastinum/Nodes: No hilar or mediastinal adenopathy. Esophagus and the thyroid gland are grossly unremarkable. No mediastinal fluid collection. Lungs/Pleura: Minimal bibasilar linear atelectasis/scarring. There is no focal consolidation, pleural effusion, or pneumothorax. The central airways are patent. Musculoskeletal: There is degenerative changes of the spine. No acute osseous pathology. Review of the MIP images confirms the above findings. CTA ABDOMEN AND PELVIS FINDINGS VASCULAR Aorta: Mild atherosclerotic calcification. No aneurysmal dilatation or dissection. Celiac: Patent without evidence of aneurysm, dissection, vasculitis or significant stenosis. SMA: Mild noncalcified plaque in the origin of the SMA with minimal luminal narrowing. The SMA is patent. Renals: Atherosclerotic calcification of the renal ostia. The renal arteries are patent. IMA: Patent without evidence of aneurysm, dissection, vasculitis or significant stenosis. Inflow: Mild atherosclerotic calcification. No aneurysm or dissection. The iliac arteries are patent. Veins: No obvious venous abnormality within the limitations of this arterial phase study. Review of the MIP images confirms the above findings. NON-VASCULAR No intra-abdominal free air or free fluid. Hepatobiliary: No focal liver abnormality is seen. No gallstones, gallbladder wall thickening, or biliary dilatation. Pancreas: Unremarkable. No pancreatic ductal dilatation or surrounding inflammatory changes. Spleen: Normal in size without focal abnormality. Adrenals/Urinary Tract: A 13 mm right adrenal adenoma. The left adrenal gland is unremarkable. Bilateral renal cysts as well as smaller hypodensities which are not characterized. There is no  hydronephrosis on either side. The visualized ureters and urinary bladder appear unremarkable. Stomach/Bowel: Small hiatal hernia. There is postsurgical changes of the bowel. No bowel obstruction or active inflammation. There is colonic diverticulosis as well as a 15 mm duodenal diverticulum without active inflammatory changes. The appendix is normal. Lymphatic: No adenopathy. Reproductive: The prostate is mildly enlarged measuring approximately 7 cm in transverse axial diameter. Other: Midline vertical anterior abdominal wall incisional scar. Small fat containing umbilical hernia. Musculoskeletal: Multilevel degenerative changes of the spine. No acute osseous pathology. Indeterminate 13 mm sclerotic focus in the L3 vertebra, possibly a bone island. Review of the MIP images confirms the above findings. IMPRESSION: 1. No acute intrathoracic, abdominal, or pelvic pathology. No aortic aneurysm or dissection. No CT evidence of pulmonary embolism. 2. Coronary vascular calcification, and Aortic Atherosclerosis (ICD10-I70.0). 3. Diverticulosis of the colon and duodenum. No bowel obstruction or active inflammation. Normal appendix. Electronically Signed   By: Anner Crete M.D.   On: 03/28/2018 04:08   US Abdomen Limited Ruq  Result Date: 03/28/2018 CLINICAL DATA:  Chest pain last night EXAM: ULTRASOUND ABDOMEN LIMITED RIGHT UPPER QUADRANT COMPARISON:  None. FINDINGS: Gallbladder: Gallbladder wall thickening to 6 mm. No gallbladder distension. Small amount pericholecystic fluid. No echogenic gallstones present. Negative sonographic Murphy's sign. Common bile duct:  Diameter: Normal 4 mm Liver: No focal lesion identified. Within normal limits in parenchymal echogenicity. Portal vein is patent on color Doppler imaging with normal direction of blood flow towards the liver. IMPRESSION: 1. Thickened gallbladder wall without gallbladder distention or gallstones. Negative sonographic Murphy's sign. 2. No biliary duct  dilatation Electronically Signed   By: Suzy Bouchard M.D.   On: 03/28/2018 09:59        Scheduled Meds: . [MAR Hold] aspirin  325 mg Oral Daily  . [MAR Hold] atorvastatin  80 mg Oral Daily  . [MAR Hold] enoxaparin (LOVENOX) injection  40 mg Subcutaneous Q24H  . [MAR Hold] pneumococcal 23 valent vaccine  0.5 mL Intramuscular Tomorrow-1000  . [MAR Hold] sertraline  50 mg Oral Daily  . [MAR Hold] tamsulosin  0.4 mg Oral Daily   Continuous Infusions: . sodium chloride 100 mL/hr at 03/29/18 0900  . [MAR Hold] cefoTEtan (CEFOTAN) IV    . [MAR Hold] famotidine (PEPCID) IV Stopped (03/29/18 1052)  . potassium chloride       LOS: 0 days    Time spent: 25 mins.More than 50% of that time was spent in counseling and/or coordination of care.      Shelly Coss, MD [facility] Pager [phone]  If 7PM-7AM, please contact night-coverage [email or web address] Password [email or web address] 03/29/2018, 1:21 PM

## 2018-03-30 ENCOUNTER — Encounter (HOSPITAL_COMMUNITY): Payer: Self-pay | Admitting: General Surgery

## 2018-03-30 LAB — BASIC METABOLIC PANEL
Anion gap: 5 (ref 5–15)
BUN: 7 mg/dL (ref 6–20)
CO2: 25 mmol/L (ref 22–32)
Calcium: 8 mg/dL — ABNORMAL LOW (ref 8.9–10.3)
Chloride: 106 mmol/L (ref 101–111)
Creatinine, Ser: 0.78 mg/dL (ref 0.61–1.24)
GFR calc Af Amer: >60 mL/min (ref >60)
GFR calc non Af Amer: >60 mL/min (ref >60)
Glucose, Bld: 132 mg/dL — ABNORMAL HIGH (ref 65–99)
Potassium: 4.1 mmol/L (ref 3.5–5.1)
Sodium: 136 mmol/L (ref 135–145)

## 2018-03-30 LAB — CBC WITH DIFFERENTIAL/PLATELET
Basophils Absolute: 0 10*3/uL (ref 0.0–0.1)
Basophils Relative: 0 %
Eosinophils Absolute: 0 10*3/uL (ref 0.0–0.7)
Eosinophils Relative: 0 %
HCT: 35.4 % — ABNORMAL LOW (ref 39.0–52.0)
Hemoglobin: 11.6 g/dL — ABNORMAL LOW (ref 13.0–17.0)
Lymphocytes Relative: 10 %
Lymphs Abs: 1 10*3/uL (ref 0.7–4.0)
MCH: 29.9 pg (ref 26.0–34.0)
MCHC: 32.8 g/dL (ref 30.0–36.0)
MCV: 91.2 fL (ref 78.0–100.0)
Monocytes Absolute: 0.5 10*3/uL (ref 0.1–1.0)
Monocytes Relative: 5 %
Neutro Abs: 8 10*3/uL — ABNORMAL HIGH (ref 1.7–7.7)
Neutrophils Relative %: 85 %
Platelets: 176 10*3/uL (ref 150–400)
RBC: 3.88 MIL/uL — ABNORMAL LOW (ref 4.22–5.81)
RDW: 13.1 % (ref 11.5–15.5)
WBC: 9.4 10*3/uL (ref 4.0–10.5)

## 2018-03-30 MED ORDER — FAMOTIDINE 20 MG PO TABS: 20.0000 mg | ORAL_TABLET | Freq: Two times a day (BID) | ORAL | Status: DC

## 2018-03-30 MED ORDER — TRAMADOL HCL 50 MG PO TABS: 50.0000 mg | ORAL_TABLET | Freq: Four times a day (QID) | ORAL | 0 refills | Status: DC | PRN

## 2018-03-30 NOTE — Progress Notes (Signed)
Pt taken out to vehicle via wheelchair.   [place]  Carmel Garfield, RN

## 2018-03-30 NOTE — Progress Notes (Signed)
Pt reports missing gray tshirt. Says it has been missing since being in the ED and never made it to the unit with him. Gave pt disposable scrub top to travel home in.   [place]  Betzaida Cremeens, RN

## 2018-03-30 NOTE — Progress Notes (Signed)
Pt educated on d/c instructions; IV removed; belongings collected; ride notified; pt eating lunch; will continue to monitor.   [place]  Monserrath Junio, RN

## 2018-03-30 NOTE — Progress Notes (Signed)
Patient ID: Benjamin Stanley, male   DOB: 1941-06-27, 77 y.o.   MRN: [id number]    1 Day Post-Op  Subjective: Pt feels much better today.  Only pain is soreness.  Tolerating clear liquids.  No nausea.  Voiding on his own  Objective: Vital signs in last 24 hours: Temp:  [97.5 F (36.4 C)-98.4 F (36.9 C)] 97.5 F (36.4 C) (05/09 0922) Pulse Rate:  [46-84] 76 (05/09 0718) Resp:  [12-25] 23 (05/09 0718) BP: (104-139)/(61-81) 124/81 (05/09 0718) SpO2:  [93 %-100 %] 100 % (05/09 0718) Weight:  [114.4 kg (252 lb 1.6 oz)] 114.4 kg (252 lb 1.6 oz) (05/08 1329) Last BM Date: 03/27/18  Intake/Output from previous day: 05/08 0701 - 05/09 0700 In: 2780 [P.O.:480; I.V.:2200; IV Piggyback:100] Out: 620 [Urine:600; Blood:20] Intake/Output this shift: Total I/O In: 650 [P.O.:600; IV Piggyback:50] Out: 400 [Urine:400]  PE: Abd: soft, appropriately tender, +BS, ND, incisions c/d/i  Lab Results:  Recent Labs    03/29/18 0312 03/30/18 0329  WBC 8.6 9.4  HGB 12.2* 11.6*  HCT 36.5* 35.4*  PLT 179 176   BMET Recent Labs    03/29/18 0312 03/30/18 0329  NA 136 136  K 3.4* 4.1  CL 105 106  CO2 25 25  GLUCOSE 125* 132*  BUN 10 7  CREATININE 1.03 0.78  CALCIUM 7.9* 8.0*   PT/INR No results for input(s): LABPROT, INR in the last 72 hours. CMP     Component Value Date/Time   NA 136 03/30/2018 0329   K 4.1 03/30/2018 0329   CL 106 03/30/2018 0329   CO2 25 03/30/2018 0329   GLUCOSE 132 (H) 03/30/2018 0329   BUN 7 03/30/2018 0329   CREATININE 0.78 03/30/2018 0329   CALCIUM 8.0 (L) 03/30/2018 0329   PROT 6.6 03/28/2018 0109   ALBUMIN 3.6 03/28/2018 0109   AST 18 03/28/2018 0109   ALT 13 (L) 03/28/2018 0109   ALKPHOS 75 03/28/2018 0109   BILITOT 1.1 03/28/2018 0109   GFRNONAA >60 03/30/2018 0329   GFRAA >60 03/30/2018 0329   Lipase     Component Value Date/Time   LIPASE 50 03/28/2018 0109       Studies/Results: Nm Hepatobiliary Liver Func  Result Date:  03/28/2018 CLINICAL DATA:  Right upper quadrant pain. EXAM: NUCLEAR MEDICINE HEPATOBILIARY IMAGING TECHNIQUE: Sequential images of the abdomen were obtained out to 60 minutes following intravenous administration of radiopharmaceutical. RADIOPHARMACEUTICALS:  5.1 mCi Tc-73m  Choletec IV COMPARISON:  None. FINDINGS: The study is somewhat limited as the patient could not tolerate morphine. Additionally, a lateral view was not originally obtained. Subsequent lateral imaging at 4 hours was then obtained. No filling of the gallbladder in the first hour of imaging. At 2 hours, there was no definitive filling of the gallbladder. However, activity just to the right of midline along the inferior aspect of the liver was not definitive. As a result, delayed 4 hour imaging was obtained. No clear evidence of gallbladder filling is identified. IMPRESSION: Study is limited as above. However, there is no convincing evidence of gallbladder filling. Lack of gallbladder filling can be seen in the setting of acute cholecystitis, prolonged fasting, recent morphine, critical illness, and chronic cholecystitis. Recommend clinical correlation. Electronically Signed   By: Dorise Bullion III M.D   On: 03/28/2018 20:40    Anti-infectives: Anti-infectives (From admission, onward)   Start     Dose/Rate Route Frequency Ordered Stop   03/29/18 1336  cefoTEtan in Dextrose 5% (CEFOTAN) 2-2.08 GM-%(50ML) IVPB  Note to Pharmacy:  [facility]   : cabinet override      03/29/18 1336 03/30/18 0144   03/29/18 1300  cefoTEtan (CEFOTAN) 2 g in sodium chloride 0.9 % 100 mL IVPB     2 g 200 mL/hr over 30 Minutes Intravenous To [facility] 03/29/18 1232 03/29/18 1410       Assessment/Plan  chronic acalculous cholecystitis, POD 1, s/p lap chole -patient doing well today.  Pain is minimal and controlled with ultram. -adv diet.  If tolerates lunch, then he is surgically stable for DC home from our standpoint. -ultram has already  been called into his pharmacy by myself. -follow up has also been arranged in our clinic on 5-21 prior to his trip on 5-22. -DC instructions has been gone over with patient as well.  FEN - heart healthy diet VTE - SCDs/Lovenox ID - none   LOS: 1 day    Henreitta Cea , [facility] 03/30/2018, 10:24 AM Pager: [phone]

## 2018-03-30 NOTE — Discharge Summary (Signed)
Physician Discharge Summary  Benjamin Stanley WPY:[id number] DOB: 05-03-1941 DOA: 03/28/2018  PCP: Patient, No Pcp Per  Admit date: 03/28/2018 Discharge date: 03/30/2018  Admitted From: Home Disposition:  Home  Discharge Condition:Stable CODE STATUS:FULL Diet recommendation: Heart Healthy  Brief/Interim Summary:  Patient is a 77 year old male with past medical history of hypertension, hyperlipidemia,PUD who presented to the emergency department with complaints of substernal chest pain, diaphoresis, right-sided upper abdominal pain daily.  Patient was admitted to rule out ACS.  His chest pain  Resolved after admission.  Troponins remain negative.  HIDA scan came out to be abnormal.  Surgery consulted and he underwent lap cholecystectomy on 03/29/18 for suspected chronic cholecystitis. Patient has been cleared for discharge by surgery today.  He will follow-up with general surgery as an outpatient.  Following problems were addressed during his hospitalization:   Abdominal pain/abnormal HIDA scan:  Abnormal HIDA scan.US gallbladder showed thickened gallbladder wall without gallbladder distention or gallstones. Suspected chronic cholecystitis.  Postoperatively he was found to have severe chronic cholecystitis with partially necrotic gallbladder. Underwent  lap  cholecystectomy .  Chest pain: Resolved.  EKG did not show any ischemic changes.  Troponin is negative.  Hypertension: Currently normotensive.  Resume home antihypertensives.  Hyperlipidemia: Continue statin    Discharge Diagnoses:  Principal Problem:   Chest pain Active Problems:   Hypertension   History of gastric ulcer   Hyperlipidemia    Discharge Instructions  Discharge Instructions    Diet - low sodium heart healthy   Complete by:  As directed    Discharge instructions   Complete by:  As directed    1) Take prescribed medications as instructed. 2) Follow up with general surgery as an outpatient.   Increase  activity slowly   Complete by:  As directed      Allergies as of 03/30/2018      Reactions   Levaquin [levofloxacin In D5w] Other (See Comments)   Joint pain and upset stomach   Morphine And Related Nausea And Vomiting   Promethazine Anxiety   Sulfa Antibiotics Rash      Medication List    TAKE these medications   atorvastatin 80 MG tablet Commonly known as:  LIPITOR Take 80 mg by mouth daily.   cetirizine 10 MG tablet Commonly known as:  ZYRTEC Take 10 mg by mouth daily.   lisinopril-hydrochlorothiazide 20-12.5 MG tablet Commonly known as:  PRINZIDE,ZESTORETIC Take 1 tablet by mouth daily.   metoprolol succinate 100 MG 24 hr tablet Commonly known as:  TOPROL-XL Take 100 mg by mouth daily.   omeprazole 40 MG capsule Commonly known as:  PRILOSEC Take 40 mg by mouth daily.   sertraline 50 MG tablet Commonly known as:  ZOLOFT Take 50 mg by mouth daily.   tamsulosin 0.4 MG Caps capsule Commonly known as:  FLOMAX Take 0.4 mg by mouth daily.   traMADol 50 MG tablet Commonly known as:  ULTRAM Take 1 tablet (50 mg total) by mouth every 6 (six) hours as needed for moderate pain.      Follow-up [facility], [place]. Go on 04/11/2018.   Specialty:  General Surgery Why:  Your appointment is 05/21 at 1:30 pm. Please arrive 30 minutes prior to your appointment to check in and fill out paperwork. Bring photo ID and insurance information. Contact information: [place] [phone]         Allergies  Allergen Reactions  . Levaquin [  Levofloxacin In D5w] Other (See Comments)    Joint pain and upset stomach   . Morphine And Related Nausea And Vomiting  . Promethazine Anxiety  . Sulfa Antibiotics Rash    Consultations: General surgery  Procedures/Studies: Nm Hepatobiliary Liver Func  Result Date: 03/28/2018 CLINICAL DATA:  Right upper quadrant pain. EXAM: NUCLEAR MEDICINE  HEPATOBILIARY IMAGING TECHNIQUE: Sequential images of the abdomen were obtained out to 60 minutes following intravenous administration of radiopharmaceutical. RADIOPHARMACEUTICALS:  5.1 mCi Tc-75m  Choletec IV COMPARISON:  None. FINDINGS: The study is somewhat limited as the patient could not tolerate morphine. Additionally, a lateral view was not originally obtained. Subsequent lateral imaging at 4 hours was then obtained. No filling of the gallbladder in the first hour of imaging. At 2 hours, there was no definitive filling of the gallbladder. However, activity just to the right of midline along the inferior aspect of the liver was not definitive. As a result, delayed 4 hour imaging was obtained. No clear evidence of gallbladder filling is identified. IMPRESSION: Study is limited as above. However, there is no convincing evidence of gallbladder filling. Lack of gallbladder filling can be seen in the setting of acute cholecystitis, prolonged fasting, recent morphine, critical illness, and chronic cholecystitis. Recommend clinical correlation. Electronically Signed   By: Dorise Bullion III M.D   On: 03/28/2018 20:40   Dg Chest Portable 1 View  Result Date: 03/28/2018 CLINICAL DATA:  Chest pain x1 day EXAM: PORTABLE CHEST 1 VIEW COMPARISON:  None. FINDINGS: Borderline cardiomegaly with aortic atherosclerosis. Clear lungs without effusion or pulmonary edema. No pneumothorax. No pulmonary consolidation. No acute nor suspicious osseous abnormality. Osteoarthritis of the [facility] and glenohumeral joints bilaterally. IMPRESSION: No active disease.  Aortic atherosclerosis. Electronically Signed   By: Ashley Royalty M.D.   On: 03/28/2018 01:52   Ct Angio Chest/abd/pel For Dissection W And/or Wo Contrast  Result Date: 03/28/2018 CLINICAL DATA:  77 year old male with chest pain radiating to the back. EXAM: CT ANGIOGRAPHY CHEST, ABDOMEN AND PELVIS TECHNIQUE: Multidetector CT imaging through the chest, abdomen and pelvis was  performed using the standard protocol during bolus administration of intravenous contrast. Multiplanar reconstructed images and MIPs were obtained and reviewed to evaluate the vascular anatomy. CONTRAST:  165mL ISOVUE-370 IOPAMIDOL (ISOVUE-370) INJECTION 76% COMPARISON:  Chest radiograph dated 03/28/2018 FINDINGS: CTA CHEST FINDINGS Cardiovascular: There is no cardiomegaly or pericardial effusion. There is coronary vascular calcification. There is mild atherosclerotic calcification of the thoracic aorta. No aneurysmal dilatation or evidence of dissection. The origins of the great vessels of the aortic arch appear patent. There is no CT evidence of pulmonary embolism. Mediastinum/Nodes: No hilar or mediastinal adenopathy. Esophagus and the thyroid gland are grossly unremarkable. No mediastinal fluid collection. Lungs/Pleura: Minimal bibasilar linear atelectasis/scarring. There is no focal consolidation, pleural effusion, or pneumothorax. The central airways are patent. Musculoskeletal: There is degenerative changes of the spine. No acute osseous pathology. Review of the MIP images confirms the above findings. CTA ABDOMEN AND PELVIS FINDINGS VASCULAR Aorta: Mild atherosclerotic calcification. No aneurysmal dilatation or dissection. Celiac: Patent without evidence of aneurysm, dissection, vasculitis or significant stenosis. SMA: Mild noncalcified plaque in the origin of the SMA with minimal luminal narrowing. The SMA is patent. Renals: Atherosclerotic calcification of the renal ostia. The renal arteries are patent. IMA: Patent without evidence of aneurysm, dissection, vasculitis or significant stenosis. Inflow: Mild atherosclerotic calcification. No aneurysm or dissection. The iliac arteries are patent. Veins: No obvious venous abnormality within the limitations of this arterial phase study. Review  of the MIP images confirms the above findings. NON-VASCULAR No intra-abdominal free air or free fluid. Hepatobiliary: No  focal liver abnormality is seen. No gallstones, gallbladder wall thickening, or biliary dilatation. Pancreas: Unremarkable. No pancreatic ductal dilatation or surrounding inflammatory changes. Spleen: Normal in size without focal abnormality. Adrenals/Urinary Tract: A 13 mm right adrenal adenoma. The left adrenal gland is unremarkable. Bilateral renal cysts as well as smaller hypodensities which are not characterized. There is no hydronephrosis on either side. The visualized ureters and urinary bladder appear unremarkable. Stomach/Bowel: Small hiatal hernia. There is postsurgical changes of the bowel. No bowel obstruction or active inflammation. There is colonic diverticulosis as well as a 15 mm duodenal diverticulum without active inflammatory changes. The appendix is normal. Lymphatic: No adenopathy. Reproductive: The prostate is mildly enlarged measuring approximately 7 cm in transverse axial diameter. Other: Midline vertical anterior abdominal wall incisional scar. Small fat containing umbilical hernia. Musculoskeletal: Multilevel degenerative changes of the spine. No acute osseous pathology. Indeterminate 13 mm sclerotic focus in the L3 vertebra, possibly a bone island. Review of the MIP images confirms the above findings. IMPRESSION: 1. No acute intrathoracic, abdominal, or pelvic pathology. No aortic aneurysm or dissection. No CT evidence of pulmonary embolism. 2. Coronary vascular calcification, and Aortic Atherosclerosis (ICD10-I70.0). 3. Diverticulosis of the colon and duodenum. No bowel obstruction or active inflammation. Normal appendix. Electronically Signed   By: Anner Crete M.D.   On: 03/28/2018 04:08   US Abdomen Limited Ruq  Result Date: 03/28/2018 CLINICAL DATA:  Chest pain last night EXAM: ULTRASOUND ABDOMEN LIMITED RIGHT UPPER QUADRANT COMPARISON:  None. FINDINGS: Gallbladder: Gallbladder wall thickening to 6 mm. No gallbladder distension. Small amount pericholecystic fluid. No echogenic  gallstones present. Negative sonographic Murphy's sign. Common bile duct: Diameter: Normal 4 mm Liver: No focal lesion identified. Within normal limits in parenchymal echogenicity. Portal vein is patent on color Doppler imaging with normal direction of blood flow towards the liver. IMPRESSION: 1. Thickened gallbladder wall without gallbladder distention or gallstones. Negative sonographic Murphy's sign. 2. No biliary duct dilatation Electronically Signed   By: Suzy Bouchard M.D.   On: 03/28/2018 09:59       Subjective: Patient seen and examined at the bedside this morning.  Remains comfortable.  No new issues.  Stable for discharge home today.  Discharge Exam: Vitals:   03/30/18 0718 03/30/18 0922  BP: 124/81   Pulse: 76   Resp: (!) 23   Temp: 97.6 F (36.4 C) (!) 97.5 F (36.4 C)  SpO2: 100%    Vitals:   03/30/18 0600 03/30/18 0700 03/30/18 0718 03/30/18 0922  BP:   124/81   Pulse: (!) 51 (!) 46 76   Resp: 15 17 (!) 23   Temp:   97.6 F (36.4 C) (!) 97.5 F (36.4 C)  TempSrc:   Oral Oral  SpO2: 97%  100%   Weight:      Height:        General: Pt is alert, awake, not in acute distress Cardiovascular: RRR, S1/S2 +, no rubs, no gallops Respiratory: CTA bilaterally, no wheezing, no rhonchi Abdominal: Soft, NT, ND, bowel sounds +,lap chole surgical scars Extremities: no edema, no cyanosis    The results of significant diagnostics from this hospitalization (including imaging, microbiology, ancillary and laboratory) are listed below for reference.     Microbiology: Recent Results (from the past 240 hour(s))  MRSA PCR Screening     Status: None   Collection Time: 03/28/18  6:32 PM  Result  Value Ref Range Status   MRSA by PCR NEGATIVE NEGATIVE Final    Comment:        The GeneXpert MRSA Assay (FDA approved for NASAL specimens only), is one component of a comprehensive MRSA colonization surveillance program. It is not intended to diagnose MRSA infection nor to  guide or monitor treatment for MRSA infections. Performed at [facility], [place] [address] [place], [place] [address]      Labs: BNP (last 3 results) No results for input(s): BNP in the last 8760 hours. Basic Metabolic Panel: Recent Labs  Lab 03/28/18 0115 03/29/18 0312 03/30/18 0329  NA 133* 136 136  K 4.0 3.4* 4.1  CL 98* 105 106  CO2  --  25 25  GLUCOSE 106* 125* 132*  BUN 19 10 7   CREATININE 1.20 1.03 0.78  CALCIUM  --  7.9* 8.0*   Liver Function Tests: Recent Labs  Lab 03/28/18 0109  AST 18  ALT 13*  ALKPHOS 75  BILITOT 1.1  PROT 6.6  ALBUMIN 3.6   Recent Labs  Lab 03/28/18 0109  LIPASE 50   No results for input(s): AMMONIA in the last 168 hours. CBC: Recent Labs  Lab 03/28/18 0109 03/28/18 0115 03/29/18 0312 03/30/18 0329  WBC 8.9  --  8.6 9.4  NEUTROABS 4.9  --   --  8.0*  HGB 14.0 13.6 12.2* 11.6*  HCT 41.3 40.0 36.5* 35.4*  MCV 91.4  --  90.6 91.2  PLT 223  --  179 176   Cardiac Enzymes: Recent Labs  Lab 03/28/18 0552 03/28/18 0945 03/28/18 1836  TROPONINI <0.03 <0.03 <0.03   BNP: Invalid input(s): POCBNP CBG: No results for input(s): GLUCAP in the last 168 hours. D-Dimer No results for input(s): DDIMER in the last 72 hours. Hgb A1c Recent Labs    03/28/18 0907  HGBA1C 5.4   Lipid Profile Recent Labs    03/28/18 0907  CHOL 101  HDL 38*  LDLCALC 47  TRIG 78  CHOLHDL 2.7   Thyroid function studies No results for input(s): TSH, T4TOTAL, T3FREE, THYROIDAB in the last 72 hours.  Invalid input(s): FREET3 Anemia work up No results for input(s): VITAMINB12, FOLATE, FERRITIN, TIBC, IRON, RETICCTPCT in the last 72 hours. Urinalysis No results found for: COLORURINE, APPEARANCEUR, [place], [place], [place], [place], [place], [place], PROTEINUR, UROBILINOGEN, NITRITE, LEUKOCYTESUR Sepsis Labs Invalid input(s): PROCALCITONIN,  WBC,  LACTICIDVEN Microbiology Recent Results (from the past 240 hour(s))  MRSA PCR  Screening     Status: None   Collection Time: 03/28/18  6:32 PM  Result Value Ref Range Status   MRSA by PCR NEGATIVE NEGATIVE Final    Comment:        The GeneXpert MRSA Assay (FDA approved for NASAL specimens only), is one component of a comprehensive MRSA colonization surveillance program. It is not intended to diagnose MRSA infection nor to guide or monitor treatment for MRSA infections. Performed at [facility], [place] [address] [place], [place] [address]      Time coordinating discharge: 35 minutes  SIGNED:   Shelly Coss, MD  [facility] 03/30/2018, 12:00 PM Pager [phone]  If 7PM-7AM, please contact night-coverage [email or web address] Password [email or web address]

## 2018-03-30 NOTE — Progress Notes (Signed)
Pt still waiting on his ride to arrive. Currently wearing his CPAP to take a nap. Will continue to monitor.  [place]  Barett Whidbee, RN

## 2018-04-07 DIAGNOSIS — R1013 Epigastric pain: Secondary | ICD-10-CM | POA: Diagnosis not present

## 2018-04-07 DIAGNOSIS — N400 Enlarged prostate without lower urinary tract symptoms: Secondary | ICD-10-CM | POA: Diagnosis not present

## 2018-04-07 DIAGNOSIS — E782 Mixed hyperlipidemia: Secondary | ICD-10-CM | POA: Diagnosis not present

## 2018-04-07 DIAGNOSIS — M170 Bilateral primary osteoarthritis of knee: Secondary | ICD-10-CM | POA: Diagnosis not present

## 2018-04-07 DIAGNOSIS — I10 Essential (primary) hypertension: Secondary | ICD-10-CM | POA: Diagnosis not present

## 2018-04-11 DIAGNOSIS — K819 Cholecystitis, unspecified: Secondary | ICD-10-CM | POA: Diagnosis not present

## 2018-04-11 DIAGNOSIS — R972 Elevated prostate specific antigen [PSA]: Secondary | ICD-10-CM | POA: Diagnosis not present

## 2018-04-11 DIAGNOSIS — F3342 Major depressive disorder, recurrent, in full remission: Secondary | ICD-10-CM | POA: Diagnosis not present

## 2018-04-11 DIAGNOSIS — J3089 Other allergic rhinitis: Secondary | ICD-10-CM | POA: Diagnosis not present

## 2018-04-11 DIAGNOSIS — E782 Mixed hyperlipidemia: Secondary | ICD-10-CM | POA: Diagnosis not present

## 2018-04-11 DIAGNOSIS — N400 Enlarged prostate without lower urinary tract symptoms: Secondary | ICD-10-CM | POA: Diagnosis not present

## 2018-04-11 DIAGNOSIS — I10 Essential (primary) hypertension: Secondary | ICD-10-CM | POA: Diagnosis not present

## 2018-04-11 DIAGNOSIS — M170 Bilateral primary osteoarthritis of knee: Secondary | ICD-10-CM | POA: Diagnosis not present

## 2018-05-08 DIAGNOSIS — R351 Nocturia: Secondary | ICD-10-CM | POA: Diagnosis not present

## 2018-05-08 DIAGNOSIS — N401 Enlarged prostate with lower urinary tract symptoms: Secondary | ICD-10-CM | POA: Diagnosis not present

## 2018-05-08 DIAGNOSIS — R3912 Poor urinary stream: Secondary | ICD-10-CM | POA: Diagnosis not present

## 2018-05-08 DIAGNOSIS — R972 Elevated prostate specific antigen [PSA]: Secondary | ICD-10-CM | POA: Diagnosis not present

## 2018-05-15 DIAGNOSIS — H2513 Age-related nuclear cataract, bilateral: Secondary | ICD-10-CM | POA: Diagnosis not present

## 2018-05-15 DIAGNOSIS — H2511 Age-related nuclear cataract, right eye: Secondary | ICD-10-CM | POA: Diagnosis not present

## 2018-07-12 DIAGNOSIS — M7521 Bicipital tendinitis, right shoulder: Secondary | ICD-10-CM | POA: Diagnosis not present

## 2018-07-12 DIAGNOSIS — M170 Bilateral primary osteoarthritis of knee: Secondary | ICD-10-CM | POA: Diagnosis not present

## 2018-08-01 DIAGNOSIS — Z881 Allergy status to other antibiotic agents status: Secondary | ICD-10-CM | POA: Diagnosis not present

## 2018-08-01 DIAGNOSIS — R0602 Shortness of breath: Secondary | ICD-10-CM | POA: Diagnosis not present

## 2018-08-01 DIAGNOSIS — J3489 Other specified disorders of nose and nasal sinuses: Secondary | ICD-10-CM | POA: Diagnosis not present

## 2018-08-01 DIAGNOSIS — G4730 Sleep apnea, unspecified: Secondary | ICD-10-CM | POA: Diagnosis not present

## 2018-08-01 DIAGNOSIS — Z882 Allergy status to sulfonamides status: Secondary | ICD-10-CM | POA: Diagnosis not present

## 2018-08-01 DIAGNOSIS — J069 Acute upper respiratory infection, unspecified: Secondary | ICD-10-CM | POA: Diagnosis not present

## 2018-08-01 DIAGNOSIS — J029 Acute pharyngitis, unspecified: Secondary | ICD-10-CM | POA: Diagnosis not present

## 2018-08-01 DIAGNOSIS — R05 Cough: Secondary | ICD-10-CM | POA: Diagnosis not present

## 2018-08-10 DIAGNOSIS — J0191 Acute recurrent sinusitis, unspecified: Secondary | ICD-10-CM | POA: Diagnosis not present

## 2018-08-13 DIAGNOSIS — E7800 Pure hypercholesterolemia, unspecified: Secondary | ICD-10-CM | POA: Diagnosis not present

## 2018-08-13 DIAGNOSIS — R0981 Nasal congestion: Secondary | ICD-10-CM | POA: Diagnosis not present

## 2018-08-13 DIAGNOSIS — I10 Essential (primary) hypertension: Secondary | ICD-10-CM | POA: Diagnosis not present

## 2018-08-13 DIAGNOSIS — J40 Bronchitis, not specified as acute or chronic: Secondary | ICD-10-CM | POA: Diagnosis not present

## 2018-08-13 DIAGNOSIS — Z87891 Personal history of nicotine dependence: Secondary | ICD-10-CM | POA: Diagnosis not present

## 2018-08-13 DIAGNOSIS — R05 Cough: Secondary | ICD-10-CM | POA: Diagnosis not present

## 2018-08-16 DIAGNOSIS — J209 Acute bronchitis, unspecified: Secondary | ICD-10-CM | POA: Diagnosis not present

## 2018-08-21 DIAGNOSIS — G4733 Obstructive sleep apnea (adult) (pediatric): Secondary | ICD-10-CM | POA: Diagnosis not present

## 2018-08-21 DIAGNOSIS — J0191 Acute recurrent sinusitis, unspecified: Secondary | ICD-10-CM | POA: Diagnosis not present

## 2018-08-21 DIAGNOSIS — N401 Enlarged prostate with lower urinary tract symptoms: Secondary | ICD-10-CM | POA: Diagnosis not present

## 2018-08-21 DIAGNOSIS — E669 Obesity, unspecified: Secondary | ICD-10-CM | POA: Diagnosis not present

## 2018-08-21 DIAGNOSIS — I471 Supraventricular tachycardia: Secondary | ICD-10-CM | POA: Diagnosis not present

## 2018-08-21 DIAGNOSIS — I10 Essential (primary) hypertension: Secondary | ICD-10-CM | POA: Diagnosis not present

## 2018-08-21 DIAGNOSIS — F339 Major depressive disorder, recurrent, unspecified: Secondary | ICD-10-CM | POA: Diagnosis not present

## 2018-08-28 DIAGNOSIS — J320 Chronic maxillary sinusitis: Secondary | ICD-10-CM | POA: Diagnosis not present

## 2018-08-31 DIAGNOSIS — R351 Nocturia: Secondary | ICD-10-CM | POA: Diagnosis not present

## 2018-08-31 DIAGNOSIS — N401 Enlarged prostate with lower urinary tract symptoms: Secondary | ICD-10-CM | POA: Diagnosis not present

## 2018-08-31 DIAGNOSIS — R972 Elevated prostate specific antigen [PSA]: Secondary | ICD-10-CM | POA: Diagnosis not present

## 2018-09-01 ENCOUNTER — Ambulatory Visit
Admission: RE | Admit: 2018-09-01 | Discharge: 2018-09-01 | Disposition: A | Discharge status: Home / Self Care | Payer: Medicare Other | Source: Ambulatory Visit | Attending: Family Medicine | Admitting: Family Medicine

## 2018-09-01 ENCOUNTER — Other Ambulatory Visit: Payer: Self-pay | Admitting: Family Medicine

## 2018-09-01 DIAGNOSIS — J209 Acute bronchitis, unspecified: Secondary | ICD-10-CM | POA: Diagnosis not present

## 2018-09-01 DIAGNOSIS — R058 Other specified cough: Secondary | ICD-10-CM

## 2018-09-01 DIAGNOSIS — R05 Cough: Secondary | ICD-10-CM

## 2018-09-04 ENCOUNTER — Other Ambulatory Visit: Payer: Self-pay | Admitting: Family Medicine

## 2018-09-22 DIAGNOSIS — I10 Essential (primary) hypertension: Secondary | ICD-10-CM | POA: Diagnosis not present

## 2018-09-22 DIAGNOSIS — J0191 Acute recurrent sinusitis, unspecified: Secondary | ICD-10-CM | POA: Diagnosis not present

## 2018-09-27 DIAGNOSIS — J449 Chronic obstructive pulmonary disease, unspecified: Secondary | ICD-10-CM | POA: Diagnosis not present

## 2018-09-27 DIAGNOSIS — K219 Gastro-esophageal reflux disease without esophagitis: Secondary | ICD-10-CM | POA: Diagnosis not present

## 2018-09-27 DIAGNOSIS — I10 Essential (primary) hypertension: Secondary | ICD-10-CM | POA: Diagnosis not present

## 2018-09-27 DIAGNOSIS — N401 Enlarged prostate with lower urinary tract symptoms: Secondary | ICD-10-CM | POA: Diagnosis not present

## 2018-09-27 DIAGNOSIS — J0191 Acute recurrent sinusitis, unspecified: Secondary | ICD-10-CM | POA: Diagnosis not present

## 2018-10-02 DIAGNOSIS — J309 Allergic rhinitis, unspecified: Secondary | ICD-10-CM | POA: Diagnosis not present

## 2018-10-02 DIAGNOSIS — J0191 Acute recurrent sinusitis, unspecified: Secondary | ICD-10-CM | POA: Diagnosis not present

## 2018-10-02 DIAGNOSIS — G4733 Obstructive sleep apnea (adult) (pediatric): Secondary | ICD-10-CM | POA: Diagnosis not present

## 2018-10-02 DIAGNOSIS — J449 Chronic obstructive pulmonary disease, unspecified: Secondary | ICD-10-CM | POA: Diagnosis not present

## 2018-10-06 DIAGNOSIS — R972 Elevated prostate specific antigen [PSA]: Secondary | ICD-10-CM | POA: Diagnosis not present

## 2018-10-06 DIAGNOSIS — R3912 Poor urinary stream: Secondary | ICD-10-CM | POA: Diagnosis not present

## 2018-10-06 DIAGNOSIS — N401 Enlarged prostate with lower urinary tract symptoms: Secondary | ICD-10-CM | POA: Diagnosis not present

## 2018-11-13 ENCOUNTER — Other Ambulatory Visit: Payer: Self-pay

## 2018-11-13 DIAGNOSIS — I10 Essential (primary) hypertension: Secondary | ICD-10-CM | POA: Diagnosis not present

## 2018-11-13 DIAGNOSIS — K219 Gastro-esophageal reflux disease without esophagitis: Secondary | ICD-10-CM | POA: Diagnosis not present

## 2018-11-13 DIAGNOSIS — Z23 Encounter for immunization: Secondary | ICD-10-CM | POA: Diagnosis not present

## 2018-11-13 DIAGNOSIS — M5412 Radiculopathy, cervical region: Secondary | ICD-10-CM | POA: Diagnosis not present

## 2018-11-13 DIAGNOSIS — Z0000 Encounter for general adult medical examination without abnormal findings: Secondary | ICD-10-CM | POA: Diagnosis not present

## 2018-11-13 DIAGNOSIS — R05 Cough: Secondary | ICD-10-CM

## 2018-11-13 DIAGNOSIS — J309 Allergic rhinitis, unspecified: Secondary | ICD-10-CM | POA: Diagnosis not present

## 2018-11-13 DIAGNOSIS — R059 Cough, unspecified: Secondary | ICD-10-CM

## 2018-11-13 DIAGNOSIS — T148XXA Other injury of unspecified body region, initial encounter: Secondary | ICD-10-CM | POA: Diagnosis not present

## 2018-12-04 ENCOUNTER — Other Ambulatory Visit: Payer: Self-pay | Admitting: Family Medicine

## 2018-12-12 DIAGNOSIS — R1032 Left lower quadrant pain: Secondary | ICD-10-CM | POA: Diagnosis not present

## 2018-12-12 DIAGNOSIS — S86912A Strain of unspecified muscle(s) and tendon(s) at lower leg level, left leg, initial encounter: Secondary | ICD-10-CM | POA: Diagnosis not present

## 2019-01-15 DIAGNOSIS — G43809 Other migraine, not intractable, without status migrainosus: Secondary | ICD-10-CM | POA: Diagnosis not present

## 2019-01-17 ENCOUNTER — Ambulatory Visit (INDEPENDENT_AMBULATORY_CARE_PROVIDER_SITE_OTHER): Payer: Medicare Other | Admitting: Allergy

## 2019-01-17 ENCOUNTER — Encounter: Payer: Self-pay | Admitting: Allergy

## 2019-01-17 VITALS — BP 128/82 | HR 63 | Resp 16 | Ht 70.0 in | Wt 269.0 lb

## 2019-01-17 DIAGNOSIS — J3089 Other allergic rhinitis: Secondary | ICD-10-CM | POA: Diagnosis not present

## 2019-01-17 DIAGNOSIS — J4530 Mild persistent asthma, uncomplicated: Secondary | ICD-10-CM | POA: Diagnosis not present

## 2019-01-17 MED ORDER — IPRATROPIUM BROMIDE 0.06 % NA SOLN: 2.0000 | Freq: Four times a day (QID) | NASAL | 5 refills | Status: DC

## 2019-01-17 NOTE — Progress Notes (Signed)
New Patient Note  RE: Benjamin Stanley MRN: [id number] DOB: 1941-05-02 Date of Office Visit: 01/17/2019  Referring provider: [name]* Primary care provider: [name], MD  Chief Complaint: allergies  History of present illness: Benjamin Stanley is a 78 y.o. male presenting today for consultation for allergic rhinitis.  He reports a lots of sneezing and runny nose primarily. He states he will have periods of time where he has the sneezing and runny nose and then he has symptom-free periods in between.  He has not been able to identify what seems to trigger the sneezing and runny nose.  He does feel that it occurs more in the mornings.  He is not sure if it is related to his CPAP use.  He has tried both zyrtec and claritin and did not find any improvement in the runny nose or sneezing with daily use.  He has also used Azelastine and flonase and did not feel the runny nose was any better with these either.    He states with the sneezing and runny nose that he often will have a cough.  He states the cough seemed to get bad in the fall 2019.  He has seen his PCP on occasion for this symptom.  He was started on singulair in October and states he no longer coughs since he has been on singulair.  However he also started on Breo 100 mcg 1 puff daily November/December.  He has an albuterol inhaler but has not used since his been on Breo.  He denies any nighttime awakenings.  He does recall being on oral steroid for 7 days around October that also improved his cough as well as his allergy symptoms.   Per review of his PCP notes he was on lisinopril but this was stopped due to the cough concern.  He has OSA and uses CPAP.  He uses [employer] to clean/sanitize his CPAP daily in the mornings.    He denies any history of eczema or food allergy.   Review of systems: Review of Systems  Constitutional: Negative for chills, fever and malaise/fatigue.  HENT: Positive for  congestion. Negative for ear discharge, ear pain, nosebleeds, sinus pain and sore throat.   Eyes: Negative for pain, discharge and redness.  Respiratory: Negative for cough, sputum production, shortness of breath and wheezing.   Cardiovascular: Negative for chest pain.  Gastrointestinal: Negative for abdominal pain, constipation, diarrhea, heartburn, nausea and vomiting.  Musculoskeletal: Negative for joint pain.  Skin: Negative for itching and rash.  Neurological: Negative for headaches.    All other systems negative unless noted above in HPI  Past medical history: Past Medical History:  Diagnosis Date  . Asthma   . Colon cancer ([place])    s/p resection, stage I, recent colonoscopy negative 4 months ago  . Gastric ulcer   . Hyperlipidemia   . Hypertension     Past surgical history: Past Surgical History:  Procedure Laterality Date  . CHOLECYSTECTOMY N/A 03/29/2018   Procedure: LAPAROSCOPIC CHOLECYSTECTOMY;  Surgeon: Greer Pickerel, MD;  Location: [place];  Service: General;  Laterality: N/A;  . EXPLORATORY LAPAROTOMY W/ BOWEL RESECTION     sigmoid colectomy for colon cancer and colon perf after colonoscopy  . KNEE ARTHROSCOPY     several prior knee scopes in the remote past  . NASAL SINUS SURGERY     remote 20+yrs ago  . NO PAST SURGERIES    . REPLACEMENT TOTAL KNEE BILATERAL      Family history:  Family History  Problem Relation Age of Onset  . Allergic rhinitis Father   . Urticaria Neg Hx   . Immunodeficiency Neg Hx   . Eczema Neg Hx   . Atopy Neg Hx   . Asthma Neg Hx   . Angioedema Neg Hx     Social history: He lives in a home without carpeting with gas heating and central cooling.  There are no pets in the home.  There is no concern for water damage, mildew or roaches in the home.  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 Always use your most recent med list.        atorvastatin 80 MG tablet Commonly known as:  LIPITOR Take 80 mg by mouth daily.   BREO ELLIPTA 100-25 MCG/INH Aepb Generic drug:  fluticasone furoate-vilanterol USE one inhalation daily   cetirizine 10 MG tablet Commonly known as:  ZYRTEC Take 10 mg by mouth daily.   gabapentin 300 MG capsule Commonly known as:  NEURONTIN   hydrochlorothiazide 12.5 MG tablet Commonly known as:  HYDRODIURIL Take 25 mg by mouth daily.   ipratropium 0.06 % nasal spray Commonly known as:  ATROVENT Place 2 sprays into both nostrils 4 (four) times daily.   losartan 50 MG tablet Commonly known as:  COZAAR Take 100 mg by mouth daily.   metoprolol succinate 100 MG 24 hr tablet Commonly known as:  TOPROL-XL Take 100 mg by mouth daily.   montelukast 10 MG tablet Commonly known as:  SINGULAIR   sertraline 50 MG tablet Commonly known as:  ZOLOFT Take 50 mg by mouth daily.   tamsulosin 0.4 MG Caps capsule Commonly known as:  FLOMAX Take 0.4 mg by mouth daily.   VENTOLIN HFA 108 (90 Base) MCG/ACT inhaler Generic drug:  albuterol INHALE 1 TO 2 PUFFS INTO THE LUNGS EVERY 4 TO 6 HOURS AS NEEDED FOPR BREATHING PROBLEMS       Known medication allergies: Allergies  Allergen Reactions  . Levaquin [Levofloxacin In D5w] Other (See Comments)    Joint pain and upset stomach   . Morphine And Related Nausea And Vomiting  . Promethazine Anxiety  . Sulfa Antibiotics Rash     Physical examination: Blood pressure 128/82, pulse 63, resp. rate 16, height 5\' 10"  (1.778 m), weight 269 lb (122 kg), SpO2 95 %.  General: Alert,  interactive, in no acute distress. HEENT: PERRLA, TMs pearly gray, turbinates mildly edematous without discharge, post-pharynx non erythematous. Neck: Supple without lymphadenopathy. Lungs: Clear to auscultation without wheezing, rhonchi or rales. {no increased work of breathing. CV: Normal S1, S2 without murmurs. Abdomen: Nondistended, nontender. Skin: Back is pretty much covered with seborrheic keratosis lesions. Extremities:  No clubbing, cyanosis or edema. Neuro:   Grossly intact.  Diagnositics/Labs:  Spirometry: FEV1: 2.54L 87%, FVC: 3.15L 77%, ratio consistent with Nonobstructive pattern  Allergy testing: deferred due to lack of back space due to seb keratosis lesions  Assessment and plan:  Rhinitis, presumed allergic  - will obtain environmental allergy panel and will call you with the results  - use nasal Atrovent 0.06%-2 sprays each nostril up to 4 times a day as needed for runny nose/nasal drip  - continue Singulair 10mg  daily - take at bedtime  - additional recommendation based on allergy panel  Asthma, likely allergic driven  - improved on singulair and Breo  - continue Breo 143mcg 1 puff daily  - Singulair as above  - have access to albuterol inhaler 2 puffs every 4-6 hours as needed for cough/wheeze/shortness of breath/chest tightness.  May use 15-20 minutes prior to activity.   Monitor frequency of use.   Follow-up 4 months or sooner if needed    I appreciate the opportunity to take part in Tania's care. Please do not hesitate to contact me with questions.  Sincerely,   Prudy Feeler, MD Allergy/Immunology [facility]

## 2019-01-17 NOTE — Patient Instructions (Addendum)
Rhinitis  - will obtain environmental allergy panel and will call you with the results  - use nasal Atrovent 0.06%-2 sprays each nostril up to 4 times a day as needed for runny nose/nasal drip  - continue Singulair 10mg  daily - take at bedtime  - additional recommendation based on allergy panel  Asthma, likely allergic driven  - continue Breo 184mcg 1 puff daily  - Singulair as above  - have access to albuterol inhaler 2 puffs every 4-6 hours as needed for cough/wheeze/shortness of breath/chest tightness.  May use 15-20 minutes prior to activity.   Monitor frequency of use.   Follow-up 4 months or sooner if needed

## 2019-01-18 NOTE — Addendum Note (Signed)
Addended by: Lucrezia Starch I on: 01/18/2019 07:35 AM   Modules accepted: Orders

## 2019-01-20 LAB — ALLERGENS W/TOTAL IGE AREA 2
Alternaria Alternata IgE: 0.73 kU/L — AB
Aspergillus Fumigatus IgE: 0.84 kU/L — AB
Bermuda Grass IgE: <0.1 kU/L
Cat Dander IgE: <0.1 kU/L
Cedar, Mountain IgE: <0.1 kU/L
Cladosporium Herbarum IgE: 0.14 kU/L — AB
Cockroach, German IgE: <0.1 kU/L
Common Silver Birch IgE: <0.1 kU/L
Cottonwood IgE: <0.1 kU/L
D Farinae IgE: <0.1 kU/L
D Pteronyssinus IgE: <0.1 kU/L
Dog Dander IgE: <0.1 kU/L
Elm, American IgE: <0.1 kU/L
IgE (Immunoglobulin E), Serum: 247 IU/mL (ref 6–495)
Johnson Grass IgE: <0.1 kU/L
Maple/Box Elder IgE: <0.1 kU/L
Mouse Urine IgE: <0.1 kU/L
Oak, White IgE: <0.1 kU/L
Pecan, Hickory IgE: <0.1 kU/L
Penicillium Chrysogen IgE: 0.29 kU/L — AB
Pigweed, Rough IgE: <0.1 kU/L
Ragweed, Short IgE: <0.1 kU/L
Sheep Sorrel IgE Qn: <0.1 kU/L
Timothy Grass IgE: <0.1 kU/L
White Mulberry IgE: <0.1 kU/L

## 2019-02-08 ENCOUNTER — Emergency Department (HOSPITAL_COMMUNITY): Payer: Medicare Other

## 2019-02-08 ENCOUNTER — Encounter (HOSPITAL_COMMUNITY): Payer: Self-pay | Admitting: Emergency Medicine

## 2019-02-08 ENCOUNTER — Observation Stay (HOSPITAL_COMMUNITY)
Admission: EM | Admit: 2019-02-08 | Discharge: 2019-02-09 | Disposition: A | Discharge status: Home / Self Care | Payer: Medicare Other | Attending: Internal Medicine | Admitting: Internal Medicine

## 2019-02-08 ENCOUNTER — Other Ambulatory Visit: Payer: Self-pay

## 2019-02-08 DIAGNOSIS — Z9049 Acquired absence of other specified parts of digestive tract: Secondary | ICD-10-CM | POA: Insufficient documentation

## 2019-02-08 DIAGNOSIS — Z85038 Personal history of other malignant neoplasm of large intestine: Secondary | ICD-10-CM | POA: Insufficient documentation

## 2019-02-08 DIAGNOSIS — R0902 Hypoxemia: Secondary | ICD-10-CM | POA: Diagnosis not present

## 2019-02-08 DIAGNOSIS — E872 Acidosis, unspecified: Secondary | ICD-10-CM

## 2019-02-08 DIAGNOSIS — Z96653 Presence of artificial knee joint, bilateral: Secondary | ICD-10-CM | POA: Diagnosis not present

## 2019-02-08 DIAGNOSIS — N400 Enlarged prostate without lower urinary tract symptoms: Secondary | ICD-10-CM | POA: Insufficient documentation

## 2019-02-08 DIAGNOSIS — Z8711 Personal history of peptic ulcer disease: Secondary | ICD-10-CM

## 2019-02-08 DIAGNOSIS — Z87891 Personal history of nicotine dependence: Secondary | ICD-10-CM | POA: Insufficient documentation

## 2019-02-08 DIAGNOSIS — R110 Nausea: Secondary | ICD-10-CM | POA: Diagnosis not present

## 2019-02-08 DIAGNOSIS — Z79899 Other long term (current) drug therapy: Secondary | ICD-10-CM | POA: Insufficient documentation

## 2019-02-08 DIAGNOSIS — R079 Chest pain, unspecified: Secondary | ICD-10-CM | POA: Diagnosis not present

## 2019-02-08 DIAGNOSIS — M5136 Other intervertebral disc degeneration, lumbar region: Secondary | ICD-10-CM | POA: Insufficient documentation

## 2019-02-08 DIAGNOSIS — Z8719 Personal history of other diseases of the digestive system: Secondary | ICD-10-CM | POA: Diagnosis not present

## 2019-02-08 DIAGNOSIS — R1084 Generalized abdominal pain: Secondary | ICD-10-CM | POA: Diagnosis present

## 2019-02-08 DIAGNOSIS — J45909 Unspecified asthma, uncomplicated: Secondary | ICD-10-CM | POA: Insufficient documentation

## 2019-02-08 DIAGNOSIS — E785 Hyperlipidemia, unspecified: Secondary | ICD-10-CM | POA: Diagnosis not present

## 2019-02-08 DIAGNOSIS — R112 Nausea with vomiting, unspecified: Secondary | ICD-10-CM | POA: Diagnosis not present

## 2019-02-08 DIAGNOSIS — M1990 Unspecified osteoarthritis, unspecified site: Secondary | ICD-10-CM | POA: Diagnosis present

## 2019-02-08 DIAGNOSIS — I10 Essential (primary) hypertension: Secondary | ICD-10-CM | POA: Diagnosis not present

## 2019-02-08 DIAGNOSIS — K4090 Unilateral inguinal hernia, without obstruction or gangrene, not specified as recurrent: Secondary | ICD-10-CM | POA: Insufficient documentation

## 2019-02-08 DIAGNOSIS — R109 Unspecified abdominal pain: Secondary | ICD-10-CM | POA: Diagnosis not present

## 2019-02-08 DIAGNOSIS — R1010 Upper abdominal pain, unspecified: Secondary | ICD-10-CM | POA: Diagnosis not present

## 2019-02-08 DIAGNOSIS — E876 Hypokalemia: Secondary | ICD-10-CM | POA: Diagnosis not present

## 2019-02-08 DIAGNOSIS — R0789 Other chest pain: Secondary | ICD-10-CM

## 2019-02-08 DIAGNOSIS — Z791 Long term (current) use of non-steroidal anti-inflammatories (NSAID): Secondary | ICD-10-CM | POA: Insufficient documentation

## 2019-02-08 DIAGNOSIS — R1110 Vomiting, unspecified: Secondary | ICD-10-CM | POA: Diagnosis not present

## 2019-02-08 DIAGNOSIS — K6389 Other specified diseases of intestine: Secondary | ICD-10-CM | POA: Diagnosis not present

## 2019-02-08 DIAGNOSIS — K5730 Diverticulosis of large intestine without perforation or abscess without bleeding: Secondary | ICD-10-CM | POA: Diagnosis not present

## 2019-02-08 LAB — COMPREHENSIVE METABOLIC PANEL
ALT: 21 U/L (ref 0–44)
AST: 14 U/L — ABNORMAL LOW (ref 15–41)
Albumin: 3.5 g/dL (ref 3.5–5.0)
Alkaline Phosphatase: 59 U/L (ref 38–126)
Anion gap: 10 (ref 5–15)
BUN: 11 mg/dL (ref 8–23)
CO2: 23 mmol/L (ref 22–32)
Calcium: 8.9 mg/dL (ref 8.9–10.3)
Chloride: 102 mmol/L (ref 98–111)
Creatinine, Ser: 0.82 mg/dL (ref 0.61–1.24)
GFR calc Af Amer: >60 mL/min (ref >60)
GFR calc non Af Amer: >60 mL/min (ref >60)
Glucose, Bld: 134 mg/dL — ABNORMAL HIGH (ref 70–99)
Potassium: 3.4 mmol/L — ABNORMAL LOW (ref 3.5–5.1)
Sodium: 135 mmol/L (ref 135–145)
Total Bilirubin: 0.5 mg/dL (ref 0.3–1.2)
Total Protein: 6.1 g/dL — ABNORMAL LOW (ref 6.5–8.1)

## 2019-02-08 LAB — CBC WITH DIFFERENTIAL/PLATELET
Abs Immature Granulocytes: 0.04 10*3/uL (ref 0.00–0.07)
Basophils Absolute: 0 10*3/uL (ref 0.0–0.1)
Basophils Relative: 1 %
Eosinophils Absolute: 0.2 10*3/uL (ref 0.0–0.5)
Eosinophils Relative: 2 %
HCT: 39.9 % (ref 39.0–52.0)
Hemoglobin: 13.4 g/dL (ref 13.0–17.0)
Immature Granulocytes: 1 %
Lymphocytes Relative: 13 %
Lymphs Abs: 1.1 10*3/uL (ref 0.7–4.0)
MCH: 31 pg (ref 26.0–34.0)
MCHC: 33.6 g/dL (ref 30.0–36.0)
MCV: 92.4 fL (ref 80.0–100.0)
Monocytes Absolute: 0.6 10*3/uL (ref 0.1–1.0)
Monocytes Relative: 7 %
Neutro Abs: 6.8 10*3/uL (ref 1.7–7.7)
Neutrophils Relative %: 76 %
Platelets: 167 10*3/uL (ref 150–400)
RBC: 4.32 MIL/uL (ref 4.22–5.81)
RDW: 12.1 % (ref 11.5–15.5)
WBC: 8.8 10*3/uL (ref 4.0–10.5)
nRBC: 0 % (ref 0.0–0.2)

## 2019-02-08 LAB — LIPASE, BLOOD: Lipase: 27 U/L (ref 11–51)

## 2019-02-08 LAB — LACTIC ACID, PLASMA
Lactic Acid, Venous: 2.5 mmol/L (ref 0.5–1.9)
Lactic Acid, Venous: 1.8 mmol/L (ref 0.5–1.9)

## 2019-02-08 LAB — I-STAT TROPONIN, ED: Troponin i, poc: 0 ng/mL (ref 0.00–0.08)

## 2019-02-08 MED ORDER — SODIUM CHLORIDE 0.9% FLUSH
3.0000 mL | Freq: Two times a day (BID) | INTRAVENOUS | Status: DC
  Administered 2019-02-08: 3 mL via INTRAVENOUS

## 2019-02-08 MED ORDER — ACETAMINOPHEN 650 MG RE SUPP: 650.0000 mg | Freq: Four times a day (QID) | RECTAL | Status: DC | PRN

## 2019-02-08 MED ORDER — SODIUM CHLORIDE 0.9 % IV SOLN
Freq: Once | INTRAVENOUS | Status: AC
  Administered 2019-02-08: 06:00:00 via INTRAVENOUS

## 2019-02-08 MED ORDER — HYDROCHLOROTHIAZIDE 25 MG PO TABS
25.0000 mg | ORAL_TABLET | Freq: Every day | ORAL | Status: DC
  Administered 2019-02-08 – 2019-02-09 (×2): 25 mg via ORAL
  Filled 2019-02-08 (×2): qty 1

## 2019-02-08 MED ORDER — LIDOCAINE VISCOUS HCL 2 % MT SOLN
15.0000 mL | Freq: Once | OROMUCOSAL | Status: AC
  Administered 2019-02-08: 15 mL via ORAL
  Filled 2019-02-08: qty 15

## 2019-02-08 MED ORDER — MONTELUKAST SODIUM 10 MG PO TABS
10.0000 mg | ORAL_TABLET | Freq: Every day | ORAL | Status: DC
  Administered 2019-02-08: 10 mg via ORAL
  Filled 2019-02-08: qty 1

## 2019-02-08 MED ORDER — LOSARTAN POTASSIUM 50 MG PO TABS
100.0000 mg | ORAL_TABLET | Freq: Every day | ORAL | Status: DC
  Administered 2019-02-08 – 2019-02-09 (×2): 100 mg via ORAL
  Filled 2019-02-08 (×2): qty 2

## 2019-02-08 MED ORDER — TAMSULOSIN HCL 0.4 MG PO CAPS
0.4000 mg | ORAL_CAPSULE | Freq: Every day | ORAL | Status: DC
  Administered 2019-02-08 – 2019-02-09 (×2): 0.4 mg via ORAL
  Filled 2019-02-08 (×2): qty 1

## 2019-02-08 MED ORDER — IOHEXOL 300 MG/ML  SOLN
125.0000 mL | Freq: Once | INTRAMUSCULAR | Status: AC | PRN
  Administered 2019-02-08: 125 mL via INTRAVENOUS

## 2019-02-08 MED ORDER — SERTRALINE HCL 50 MG PO TABS
50.0000 mg | ORAL_TABLET | Freq: Every day | ORAL | Status: DC
  Administered 2019-02-08 – 2019-02-09 (×2): 50 mg via ORAL
  Filled 2019-02-08 (×2): qty 1

## 2019-02-08 MED ORDER — ONDANSETRON HCL 4 MG/2ML IJ SOLN
4.0000 mg | Freq: Once | INTRAMUSCULAR | Status: DC
  Filled 2019-02-08: qty 2

## 2019-02-08 MED ORDER — SODIUM CHLORIDE 0.9% FLUSH
3.0000 mL | Freq: Two times a day (BID) | INTRAVENOUS | Status: DC
  Administered 2019-02-08 – 2019-02-09 (×2): 3 mL via INTRAVENOUS

## 2019-02-08 MED ORDER — ATORVASTATIN CALCIUM 80 MG PO TABS
80.0000 mg | ORAL_TABLET | Freq: Every day | ORAL | Status: DC
  Administered 2019-02-08 – 2019-02-09 (×2): 80 mg via ORAL
  Filled 2019-02-08 (×2): qty 1

## 2019-02-08 MED ORDER — ALUM & MAG HYDROXIDE-SIMETH 200-200-20 MG/5ML PO SUSP
30.0000 mL | Freq: Once | ORAL | Status: AC
  Administered 2019-02-08: 30 mL via ORAL
  Filled 2019-02-08: qty 30

## 2019-02-08 MED ORDER — METOPROLOL SUCCINATE ER 100 MG PO TB24
100.0000 mg | ORAL_TABLET | Freq: Every day | ORAL | Status: DC
  Administered 2019-02-08 – 2019-02-09 (×2): 100 mg via ORAL
  Filled 2019-02-08 (×2): qty 1

## 2019-02-08 MED ORDER — ACETAMINOPHEN 325 MG PO TABS: 650.0000 mg | ORAL_TABLET | Freq: Four times a day (QID) | ORAL | Status: DC | PRN

## 2019-02-08 MED ORDER — FENTANYL CITRATE (PF) 100 MCG/2ML IJ SOLN
50.0000 ug | Freq: Once | INTRAMUSCULAR | Status: AC
  Administered 2019-02-08: 50 ug via INTRAVENOUS
  Filled 2019-02-08: qty 2

## 2019-02-08 MED ORDER — PIPERACILLIN-TAZOBACTAM 3.375 G IVPB 30 MIN
3.3750 g | Freq: Once | INTRAVENOUS | Status: AC
  Administered 2019-02-08: 3.375 g via INTRAVENOUS
  Filled 2019-02-08: qty 50

## 2019-02-08 MED ORDER — FLUTICASONE FUROATE-VILANTEROL 100-25 MCG/INH IN AEPB
1.0000 | INHALATION_SPRAY | Freq: Every day | RESPIRATORY_TRACT | Status: DC
  Administered 2019-02-09: 1 via RESPIRATORY_TRACT
  Filled 2019-02-08: qty 28

## 2019-02-08 MED ORDER — ONDANSETRON HCL 4 MG PO TABS: 4.0000 mg | ORAL_TABLET | Freq: Four times a day (QID) | ORAL | Status: DC | PRN

## 2019-02-08 MED ORDER — SODIUM CHLORIDE 0.9 % IV SOLN: 250.0000 mL | INTRAVENOUS | Status: DC | PRN

## 2019-02-08 MED ORDER — SODIUM CHLORIDE 0.9 % IV SOLN
1.5000 g | Freq: Four times a day (QID) | INTRAVENOUS | Status: DC
  Administered 2019-02-08 – 2019-02-09 (×4): 1.5 g via INTRAVENOUS
  Filled 2019-02-08 (×5): qty 1.5

## 2019-02-08 MED ORDER — SODIUM CHLORIDE 0.9% FLUSH: 3.0000 mL | INTRAVENOUS | Status: DC | PRN

## 2019-02-08 MED ORDER — ONDANSETRON HCL 4 MG/2ML IJ SOLN: 4.0000 mg | Freq: Four times a day (QID) | INTRAMUSCULAR | Status: DC | PRN

## 2019-02-08 NOTE — Progress Notes (Signed)
Pharmacy Antibiotic Note  Rajveer Handler is a 78 y.o. male admitted on 02/08/2019 with intraabdominal infection.  Pharmacy has been consulted for Unasyn dosing.  Plan: Unasyn 1.5 gm IV q6hr Monitor renal function and C&S    Temp (24hrs), Avg:97.9 F (36.6 C), Min:97.9 F (36.6 C), Max:97.9 F (36.6 C)  Recent Labs  Lab 02/08/19 0304 02/08/19 0455 02/08/19 0649  WBC 8.8  --   --   CREATININE 0.82  --   --   LATICACIDVEN  --  2.5* 1.8    CrCl cannot be calculated (Unknown ideal weight.).    Allergies  Allergen Reactions  . Levaquin [Levofloxacin In D5w] Other (See Comments)    Joint pain and upset stomach   . Morphine And Related Nausea And Vomiting  . Promethazine Anxiety  . Sulfa Antibiotics Rash    Antimicrobials this admission: Unasyn 3/19 >>   Thank you for allowing pharmacy to be a part of this patient's care.  Alanda Slim, PharmD, [facility] Clinical Pharmacist Please see AMION for all Pharmacists' Contact Phone Numbers 02/08/2019, 12:28 PM

## 2019-02-08 NOTE — ED Notes (Signed)
Informed provider of lactic, will continue to monitor

## 2019-02-08 NOTE — ED Notes (Signed)
Walked patient to the bathroom patient did well 

## 2019-02-08 NOTE — Consult Note (Signed)
Surgical Consultation Requesting provider: Dr. Sarajane Jews  CC: abdominal pain  HPI: This is a very pleasant 78 year old man with a history of cholecystectomy in 2019 and sigmoid colectomy in 2013 for cancer (done in [place]), as well as history of gastric ulcer, hypertension, hyperlipidemia and a remote history of cardiac arrhythmia about 10 years ago but no other known cardiovascular disease who presented to the emergency room this morning with abdominal pain.  He was in his usual state of health until very early today.  Last evening he had homemade vegetable soup and several martinis with a friend and was feeling fine, he went to bed and awoke with chills, upper abdominal pain, nausea and diaphoresis.  He felt weak and like he might fall.  He tried to rest for a while but the sensations did not improve.  He did vomit one time.  He called EMS, and his symptoms have actually improved and nearly resolved by the time he got settled in the emergency room.  He now has an occasional very mild (2 out of 10) cramping sensation in the mid upper abdomen followed by passage of flatus and relief.  Denies any blood in stool or emesis.  No fevers.  No known sick contacts.  Allergies  Allergen Reactions  . Levaquin [Levofloxacin In D5w] Other (See Comments)    Joint pain and upset stomach   . Morphine And Related Nausea And Vomiting  . Promethazine Anxiety  . Sulfa Antibiotics Rash    Past Medical History:  Diagnosis Date  . Asthma   . Colon cancer ([place])    s/p resection, stage I, recent colonoscopy negative 4 months ago  . Gastric ulcer   . Hyperlipidemia   . Hypertension     Past Surgical History:  Procedure Laterality Date  . CHOLECYSTECTOMY N/A 03/29/2018   Procedure: LAPAROSCOPIC CHOLECYSTECTOMY;  Surgeon: Greer Pickerel, MD;  Location: [facility];  Service: General;  Laterality: N/A;  . EXPLORATORY LAPAROTOMY W/ BOWEL RESECTION     sigmoid colectomy for colon cancer and colon perf after colonoscopy  .  KNEE ARTHROSCOPY     several prior knee scopes in the remote past  . NASAL SINUS SURGERY     remote 20+yrs ago  . NO PAST SURGERIES    . REPLACEMENT TOTAL KNEE BILATERAL      Family History  Problem Relation Age of Onset  . Allergic rhinitis Father   . Urticaria Neg Hx   . Immunodeficiency Neg Hx   . Eczema Neg Hx   . Atopy Neg Hx   . Asthma Neg Hx   . Angioedema Neg Hx     Social History   Socioeconomic History  . Marital status: Single    Spouse name: Not on file  . Number of children: Not on file  . Years of education: Not on file  . Highest education level: Not on file  Occupational History  . Not on file  Social Needs  . Financial resource strain: Not on file  . Food insecurity:    Worry: Not on file    Inability: Not on file  . Transportation needs:    Medical: Not on file    Non-medical: Not on file  Tobacco Use  . Smoking status: Former Research scientist (life sciences)  . Smokeless tobacco: Never Used  Substance and Sexual Activity  . Alcohol use: Yes    Alcohol/week: 3.0 standard drinks    Types: 3 Standard drinks or equivalent per week    Frequency: Never  Comment: drinks martinis about 3x/week  . Drug use: Never  . Sexual activity: Not on file  Lifestyle  . Physical activity:    Days per week: Not on file    Minutes per session: Not on file  . Stress: Not on file  Relationships  . Social connections:    Talks on phone: Not on file    Gets together: Not on file    Attends religious service: Not on file    Active member of club or organization: Not on file    Attends meetings of clubs or organizations: Not on file    Relationship status: Not on file  Other Topics Concern  . Not on file  Social History Narrative  . Not on file    No current facility-administered medications on file prior to encounter.    Current Outpatient Medications on File Prior to Encounter  Medication Sig Dispense Refill  . albuterol (VENTOLIN HFA) 108 (90 Base) MCG/ACT inhaler Inhale 1-2  puffs into the lungs every 4 (four) hours as needed for wheezing.     Marland Kitchen atorvastatin (LIPITOR) 80 MG tablet Take 80 mg by mouth daily.    Marland Kitchen BREO ELLIPTA 100-25 MCG/INH AEPB Inhale 1 puff into the lungs daily.     . hydrochlorothiazide (HYDRODIURIL) 12.5 MG tablet Take 25 mg by mouth daily.    Marland Kitchen losartan (COZAAR) 50 MG tablet Take 100 mg by mouth daily.    . meloxicam (MOBIC) 15 MG tablet Take 15 mg by mouth daily.    . metoprolol succinate (TOPROL-XL) 100 MG 24 hr tablet Take 100 mg by mouth daily.    . montelukast (SINGULAIR) 10 MG tablet Take 10 mg by mouth at bedtime.     . sertraline (ZOLOFT) 50 MG tablet Take 50 mg by mouth daily.    . tamsulosin (FLOMAX) 0.4 MG CAPS capsule Take 0.4 mg by mouth daily.    Marland Kitchen ipratropium (ATROVENT) 0.06 % nasal spray Place 2 sprays into both nostrils 4 (four) times daily. (Patient not taking: Reported on 02/08/2019) 15 mL 5    Review of Systems: a complete, 10pt review of systems was completed with pertinent positives and negatives as documented in the HPI  Physical Exam: Vitals:   02/08/19 0715 02/08/19 0730  BP: 125/69 125/75  Pulse: 62 63  Resp: (!) 21 (!) 28  Temp:    SpO2: 100% 96%   Gen: A&Ox3, no distress  Head: normocephalic, atraumatic Eyes: extraocular motions intact, anicteric.  Neck: supple without mass or thyromegaly Chest: unlabored respirations, symmetrical air entry, clear bilaterally   Cardiovascular: RRR with palpable distal pulses, no pedal edema Abdomen: soft, nondistended, obese, mildly tender above into the right of the umbilicus with very deep palpation.  No rebound, no guarding, no peritonitis. No mass or organomegaly.  Extremities: warm, without edema, no deformities  Neuro: grossly intact Psych: appropriate mood and affect, normal insight  Skin: warm and dry   CBC Latest Ref Rng & Units 02/08/2019 03/30/2018 03/29/2018  WBC 4.0 - 10.5 K/uL 8.8 9.4 8.6  Hemoglobin 13.0 - 17.0 g/dL 13.4 11.6(L) 12.2(L)  Hematocrit 39.0 -  52.0 % 39.9 35.4(L) 36.5(L)  Platelets 150 - 400 K/uL 167 176 179    CMP Latest Ref Rng & Units 02/08/2019 03/30/2018 03/29/2018  Glucose 70 - 99 mg/dL 134(H) 132(H) 125(H)  BUN 8 - 23 mg/dL 11 7 10   Creatinine 0.61 - 1.24 mg/dL 0.82 0.78 1.03  Sodium 135 - 145 mmol/L 135 136 136  Potassium 3.5 - 5.1 mmol/L 3.4(L) 4.1 3.4(L)  Chloride 98 - 111 mmol/L 102 106 105  CO2 22 - 32 mmol/L 23 25 25   Calcium 8.9 - 10.3 mg/dL 8.9 8.0(L) 7.9(L)  Total Protein 6.5 - 8.1 g/dL 6.1(L) - -  Total Bilirubin 0.3 - 1.2 mg/dL 0.5 - -  Alkaline Phos 38 - 126 U/L 59 - -  AST 15 - 41 U/L 14(L) - -  ALT 0 - 44 U/L 21 - -    No results found for: INR, PROTIME  Imaging: Dg Chest 2 View  Result Date: 02/08/2019 CLINICAL DATA:  c/o chest pain that woke him from sleep at midnight tonight. Pt also experienced nausea, one episode of emesis, and diaphoresis. Pt states chest pain has resolved and moved down into his abdomen. EXAM: CHEST - 2 VIEW COMPARISON:  09/01/2018 FINDINGS: Cardiac silhouette is borderline enlarged. No mediastinal or hilar masses. No evidence of adenopathy. Lungs are clear.  No pleural effusion or pneumothorax. Skeletal structures are intact. IMPRESSION: No active cardiopulmonary disease. Electronically Signed   By: Lajean Manes M.D.   On: 02/08/2019 03:41   Ct Abdomen Pelvis W Contrast  Result Date: 02/08/2019 CLINICAL DATA:  Abdominal pain with nausea and emesis EXAM: CT ABDOMEN AND PELVIS WITH CONTRAST TECHNIQUE: Multidetector CT imaging of the abdomen and pelvis was performed using the standard protocol following bolus administration of intravenous contrast. CONTRAST:  11mL OMNIPAQUE IOHEXOL 300 MG/ML  SOLN COMPARISON:  CTA of the abdomen 03/28/2018 FINDINGS: Lower chest:  No contributory findings. Hepatobiliary: Extensive portal venous gas in the anti dependent liver. No primary hepatic abnormality. Cholecystectomywith no bile duct dilatation Pancreas: Unremarkable. Spleen: Unremarkable.  Adrenals/Urinary Tract: 16 mm right adrenal nodule that is stable from 2019, consistent with adenoma. No hydronephrosis or stone. Bilateral renal cystic densities. Unremarkable bladder. Stomach/Bowel: There is extensive portal venous gas associated with vessels of the transverse colon and mid small bowel. The underlying bowel loops are not thickened and do not show clear pneumatosis. No pneumoperitoneum. Colonic diverticulosis. Vascular/Lymphatic: Atherosclerotic calcification. No acute arterial finding. Venous findings above. No mass or adenopathy. Reproductive:Enlarged prostate. Other: No ascites or pneumoperitoneum.  Fatty right inguinal hernia. Musculoskeletal: Advanced spinal degeneration and spinal stenosis. L3-4 degenerative anterolisthesis. These results were called by telephone at the time of interpretation on 02/08/2019 at 4:46 am to Dr. Duffy Bruce , who verbally acknowledged these results. IMPRESSION: 1. Extensive portal venous gas originating from transverse colon and a segment of small bowel. The underlying bowel loops do not appear diseased and hopefully this is benign pneumatosis. Recommend correlation with lactate and cereal abdominal exam to exclude ischemic bowel. 2. Otherwise stable from 2019 CT, as described. Electronically Signed   By: Monte Fantasia M.D.   On: 02/08/2019 04:85    A/P: 78 year old gentleman with transient episode of upper abdominal pain and nausea, CT scan demonstrates portal venous gas thought to originate from the transverse colon and a segment of small bowel.  He has no fever, no tachycardia, no hypotension, no acidosis or acute kidney injury, no leukocytosis, and his lactic acid level although minimally elevated on arrival at 2.5 is now 1.8.  His abdominal exam is completely benign at this point.  Suspect this is a benign process and will resolve with bowel rest and supportive care. Continue NPO and empiric antibiotics. Serial exams/ labs.  We will continue to  monitor.   Romana Juniper, MD [facility] Surgery, [place] Pager [phone]

## 2019-02-08 NOTE — Progress Notes (Signed)
Patient admitted to room 5c09 s/p right sided abdominal pain. Patient denies abdominal pain at this time. Abdomen is distended, bowel sounds active x4. Lung sounds clear, heart rate regular s1s2 noted. Bilateral leg edema 2+. Patient given admitting education and oriented to unit.

## 2019-02-08 NOTE — ED Notes (Signed)
Patient transported to CT 

## 2019-02-08 NOTE — ED Notes (Addendum)
ED TO INPATIENT HANDOFF REPORT  ED Nurse Name and Phone #: [phone]  S Name/Age/Gender Kamarii Laskowski 78 y.o. male Room/Bed: 033C/033C  Code Status   Code Status: Prior  Home/SNF/Other Home Patient oriented to: self, place, time and situation Is this baseline? Yes   Triage Complete: Triage complete  Chief Complaint chest pain, nausea, vomiting  Triage Note Pt BIB [facility] from home, c/o chest pain that woke him from sleep at midnight tonight. Pt also experienced nausea, one episode of emesis, and diaphoresis. Pt took 81mg  asa before EMS arrival. En route, chest pain resolved and pt developed abdominal pain. Gallbladder removed. Given 4mg  zofran by EMS. EMS VSS.    Allergies Allergies  Allergen Reactions  . Levaquin [Levofloxacin In D5w] Other (See Comments)    Joint pain and upset stomach   . Morphine And Related Nausea And Vomiting  . Promethazine Anxiety  . Sulfa Antibiotics Rash    Level of Care/Admitting Diagnosis ED Disposition    ED Disposition Condition [facility] Area: [place] [[id number]]  Level of Care: Med-Surg [16]  I expect the patient will be discharged within 24 hours: Yes  LOW acuity---Tx typically complete <24 hrs---ACUTE conditions typically can be evaluated <24 hours---LABS likely to return to acceptable levels <24 hours---IS near functional baseline---EXPECTED to return to current living arrangement---NOT newly hypoxic: Meets criteria for 5C-Observation unit  Diagnosis: Abdominal pain, generalized [789.07.ICD-9-CM]  Admitting Physician: Samuella Cota [place]  Attending Physician: Samuella Cota [[id number]]  PT Class (Do Not Modify): Observation [104]  PT Acc Code (Do Not Modify): Observation [10022]       B Medical/Surgery History Past Medical History:  Diagnosis Date  . Asthma   . Colon cancer ([place])    s/p resection, stage I, recent colonoscopy negative 4 months ago  . Gastric ulcer   . Hyperlipidemia    . Hypertension    Past Surgical History:  Procedure Laterality Date  . CHOLECYSTECTOMY N/A 03/29/2018   Procedure: LAPAROSCOPIC CHOLECYSTECTOMY;  Surgeon: Greer Pickerel, MD;  Location: [facility];  Service: General;  Laterality: N/A;  . EXPLORATORY LAPAROTOMY W/ BOWEL RESECTION     sigmoid colectomy for colon cancer and colon perf after colonoscopy  . KNEE ARTHROSCOPY     several prior knee scopes in the remote past  . NASAL SINUS SURGERY     remote 20+yrs ago  . REPLACEMENT TOTAL KNEE BILATERAL       A IV Location/Drains/Wounds Patient Lines/Drains/Airways Status   Active Line/Drains/Airways    Name:   Placement date:   Placement time:   Site:   Days:   Peripheral IV 02/08/19 Left Antecubital   02/08/19    0755    Antecubital   less than 1   Incision (Closed) 03/29/18 Abdomen   03/29/18    1507     316   Incision - 5 Ports Abdomen Right;Lateral;Upper Right;Lateral;Mid Right;Lateral;Lower Medial;Upper Left;Lateral;Upper   03/29/18    1523     316          Intake/Output Last 24 hours No intake or output data in the 24 hours ending 02/08/19 1204  Labs/Imaging Results for orders placed or performed during the hospital encounter of 02/08/19 (from the past 48 hour(s))  CBC with Differential     Status: None   Collection Time: 02/08/19  3:04 AM  Result Value Ref Range   WBC 8.8 4.0 - 10.5 K/uL   RBC 4.32 4.22 - 5.81 MIL/uL  Hemoglobin 13.4 13.0 - 17.0 g/dL   HCT 39.9 39.0 - 52.0 %   MCV 92.4 80.0 - 100.0 fL   MCH 31.0 26.0 - 34.0 pg   MCHC 33.6 30.0 - 36.0 g/dL   RDW 12.1 11.5 - 15.5 %   Platelets 167 150 - 400 K/uL   nRBC 0.0 0.0 - 0.2 %   Neutrophils Relative % 76 %   Neutro Abs 6.8 1.7 - 7.7 K/uL   Lymphocytes Relative 13 %   Lymphs Abs 1.1 0.7 - 4.0 K/uL   Monocytes Relative 7 %   Monocytes Absolute 0.6 0.1 - 1.0 K/uL   Eosinophils Relative 2 %   Eosinophils Absolute 0.2 0.0 - 0.5 K/uL   Basophils Relative 1 %   Basophils Absolute 0.0 0.0 - 0.1 K/uL   Immature  Granulocytes 1 %   Abs Immature Granulocytes 0.04 0.00 - 0.07 K/uL    Comment: Performed at [facility] [address] [place], [place] [address]  Comprehensive metabolic panel     Status: Abnormal   Collection Time: 02/08/19  3:04 AM  Result Value Ref Range   Sodium 135 135 - 145 mmol/L   Potassium 3.4 (L) 3.5 - 5.1 mmol/L   Chloride 102 98 - 111 mmol/L   CO2 23 22 - 32 mmol/L   Glucose, Bld 134 (H) 70 - 99 mg/dL   BUN 11 8 - 23 mg/dL   Creatinine, Ser 0.82 0.61 - 1.24 mg/dL   Calcium 8.9 8.9 - 10.3 mg/dL   Total Protein 6.1 (L) 6.5 - 8.1 g/dL   Albumin 3.5 3.5 - 5.0 g/dL   AST 14 (L) 15 - 41 U/L   ALT 21 0 - 44 U/L   Alkaline Phosphatase 59 38 - 126 U/L   Total Bilirubin 0.5 0.3 - 1.2 mg/dL   GFR calc non Af Amer >60 >60 mL/min   GFR calc Af Amer >60 >60 mL/min   Anion gap 10 5 - 15    Comment: Performed at [facility], [place] [address] [place], [place] [address]  Lipase, blood     Status: None   Collection Time: 02/08/19  3:04 AM  Result Value Ref Range   Lipase 27 11 - 51 U/L    Comment: Performed at [facility] [address], [place], [place] [address]  I-Stat Troponin, ED (not at [facility])     Status: None   Collection Time: 02/08/19  3:12 AM  Result Value Ref Range   Troponin i, poc 0.00 0.00 - 0.08 ng/mL   Comment 3            Comment: Due to the release kinetics of cTnI, a negative result within the first hours of the onset of symptoms does not rule out myocardial infarction with certainty. If myocardial infarction is still suspected, repeat the test at appropriate intervals.   Lactic acid, plasma     Status: Abnormal   Collection Time: 02/08/19  4:55 AM  Result Value Ref Range   Lactic Acid, Venous 2.5 (HH) 0.5 - 1.9 mmol/L    Comment: CRITICAL RESULT CALLED TO, READ BACK BY AND VERIFIED WITH: GLOUSTER,J RN 02/08/2019 0545 JORDANS Performed at [facility], [place] [address], [place], [place] [address]   Lactic acid, plasma     Status:  None   Collection Time: 02/08/19  6:49 AM  Result Value Ref Range   Lactic Acid, Venous 1.8 0.5 - 1.9 mmol/L  Comment: Performed at [facility], [place] [address], [place], [place] [address]   Dg Chest 2 View  Result Date: 02/08/2019 CLINICAL DATA:  c/o chest pain that woke him from sleep at midnight tonight. Pt also experienced nausea, one episode of emesis, and diaphoresis. Pt states chest pain has resolved and moved down into his abdomen. EXAM: CHEST - 2 VIEW COMPARISON:  09/01/2018 FINDINGS: Cardiac silhouette is borderline enlarged. No mediastinal or hilar masses. No evidence of adenopathy. Lungs are clear.  No pleural effusion or pneumothorax. Skeletal structures are intact. IMPRESSION: No active cardiopulmonary disease. Electronically Signed   By: Lajean Manes M.D.   On: 02/08/2019 03:41   Ct Abdomen Pelvis W Contrast  Result Date: 02/08/2019 CLINICAL DATA:  Abdominal pain with nausea and emesis EXAM: CT ABDOMEN AND PELVIS WITH CONTRAST TECHNIQUE: Multidetector CT imaging of the abdomen and pelvis was performed using the standard protocol following bolus administration of intravenous contrast. CONTRAST:  156mL OMNIPAQUE IOHEXOL 300 MG/ML  SOLN COMPARISON:  CTA of the abdomen 03/28/2018 FINDINGS: Lower chest:  No contributory findings. Hepatobiliary: Extensive portal venous gas in the anti dependent liver. No primary hepatic abnormality. Cholecystectomywith no bile duct dilatation Pancreas: Unremarkable. Spleen: Unremarkable. Adrenals/Urinary Tract: 16 mm right adrenal nodule that is stable from 2019, consistent with adenoma. No hydronephrosis or stone. Bilateral renal cystic densities. Unremarkable bladder. Stomach/Bowel: There is extensive portal venous gas associated with vessels of the transverse colon and mid small bowel. The underlying bowel loops are not thickened and do not show clear pneumatosis. No pneumoperitoneum. Colonic diverticulosis. Vascular/Lymphatic: Atherosclerotic  calcification. No acute arterial finding. Venous findings above. No mass or adenopathy. Reproductive:Enlarged prostate. Other: No ascites or pneumoperitoneum.  Fatty right inguinal hernia. Musculoskeletal: Advanced spinal degeneration and spinal stenosis. L3-4 degenerative anterolisthesis. These results were called by telephone at the time of interpretation on 02/08/2019 at 4:46 am to Dr. Duffy Bruce , who verbally acknowledged these results. IMPRESSION: 1. Extensive portal venous gas originating from transverse colon and a segment of small bowel. The underlying bowel loops do not appear diseased and hopefully this is benign pneumatosis. Recommend correlation with lactate and cereal abdominal exam to exclude ischemic bowel. 2. Otherwise stable from 2019 CT, as described. Electronically Signed   By: Monte Fantasia M.D.   On: 02/08/2019 04:46    Pending Labs Unresulted Labs (From admission, onward)   None      Vitals/Pain Today's Vitals   02/08/19 0800 02/08/19 0815 02/08/19 0830 02/08/19 0900  BP: 129/79 131/70 127/69 126/72  Pulse: 64 (!) 59 (!) 57 (!) 58  Resp: 14 20 (!) 21 20  Temp:      TempSrc:      SpO2: 98% 97% 100% 96%  PainSc:        Isolation Precautions No active isolations  Medications Medications  ondansetron (ZOFRAN) injection 4 mg (4 mg Intravenous Refused 02/08/19 0419)  atorvastatin (LIPITOR) tablet 80 mg (has no administration in time range)  hydrochlorothiazide (HYDRODIURIL) tablet 25 mg (has no administration in time range)  losartan (COZAAR) tablet 100 mg (has no administration in time range)  metoprolol succinate (TOPROL-XL) 24 hr tablet 100 mg (has no administration in time range)  sertraline (ZOLOFT) tablet 50 mg (has no administration in time range)  tamsulosin (FLOMAX) capsule 0.4 mg (has no administration in time range)  fluticasone furoate-vilanterol (BREO ELLIPTA) 100-25 MCG/INH 1 puff (has no administration in time range)  montelukast (SINGULAIR)  tablet 10 mg (has no administration in time range)  alum & mag hydroxide-simeth (MAALOX/MYLANTA) 200-200-20 MG/5ML suspension 30 mL (30 mLs Oral Given 02/08/19 0417)    And  lidocaine (XYLOCAINE) 2 % viscous mouth solution 15 mL (15 mLs Oral Given 02/08/19 0417)  iohexol (OMNIPAQUE) 300 MG/ML solution 125 mL (125 mLs Intravenous Contrast Given 02/08/19 0422)  0.9 %  sodium chloride infusion ( Intravenous Stopped 02/08/19 0800)  fentaNYL (SUBLIMAZE) injection 50 mcg (50 mcg Intravenous Given 02/08/19 0536)  piperacillin-tazobactam (ZOSYN) IVPB 3.375 g (0 g Intravenous Stopped 02/08/19 0800)    Mobility walks Low fall risk   Focused Assessments Cardiac Assessment Handoff:    Lab Results  Component Value Date   TROPONINI <0.03 03/28/2018   No results found for: DDIMER Does the Patient currently have chest pain? No     R Recommendations: See Admitting Provider Note  Report given to:  Delana Meyer, RN  Additional Notes:  5C

## 2019-02-08 NOTE — ED Provider Notes (Signed)
[facility] EMERGENCY DEPARTMENT Provider Note   CSN: [id number] Arrival date & time: 02/08/19  0243    History   Chief Complaint Chief Complaint  Patient presents with  . Chest Pain    HPI Benjamin Stanley is a 78 y.o. male.     HPI   78 yo M with PMHx gastric ulcer, HTN, HLD, h/o cholecystectomy 03/2018 here with abd pain, nausea, chest pain. Pt states he was in his usual state of health until this early morning. He ate soup, had several martinis with a friend and was feeling fine. Went to bed and awoke with severe chest and upper abd pain, nausea, and diaphoresis. Vomited. He had some SOB as well. He called EMS and sx have now slowly improved. No h/o similar episodes. Has had colon surgery as well. He has some mild nausea now but no significant vomiting. No diarrhea. No blood in stool or emesis. No fevers. No known sick contacts.  Past Medical History:  Diagnosis Date  . Asthma   . Colon cancer ([place])    s/p resection, stage I, recent colonoscopy negative 4 months ago  . Gastric ulcer   . Hyperlipidemia   . Hypertension     Patient Active Problem List   Diagnosis Date Noted  . Chest pain 03/28/2018  . Hypertension 03/28/2018  . History of gastric ulcer 03/28/2018  . Hyperlipidemia 03/28/2018    Past Surgical History:  Procedure Laterality Date  . CHOLECYSTECTOMY N/A 03/29/2018   Procedure: LAPAROSCOPIC CHOLECYSTECTOMY;  Surgeon: Greer Pickerel, MD;  Location: [facility];  Service: General;  Laterality: N/A;  . EXPLORATORY LAPAROTOMY W/ BOWEL RESECTION     sigmoid colectomy for colon cancer and colon perf after colonoscopy  . KNEE ARTHROSCOPY     several prior knee scopes in the remote past  . NASAL SINUS SURGERY     remote 20+yrs ago  . NO PAST SURGERIES    . REPLACEMENT TOTAL KNEE BILATERAL          Home Medications    Prior to Admission medications   Medication Sig Start Date End Date Taking? Authorizing Provider  albuterol (VENTOLIN HFA)  108 (90 Base) MCG/ACT inhaler Inhale 1-2 puffs into the lungs every 4 (four) hours as needed for wheezing.  08/01/18  Yes [provider]  atorvastatin (LIPITOR) 80 MG tablet Take 80 mg by mouth daily. 01/08/18  Yes [provider]  BREO ELLIPTA 100-25 MCG/INH AEPB Inhale 1 puff into the lungs daily.  01/05/19  Yes [provider]  hydrochlorothiazide (HYDRODIURIL) 12.5 MG tablet Take 25 mg by mouth daily. 12/05/18  Yes [provider]  losartan (COZAAR) 50 MG tablet Take 100 mg by mouth daily. 12/04/18  Yes [provider]  meloxicam (MOBIC) 15 MG tablet Take 15 mg by mouth daily.   Yes [provider]  metoprolol succinate (TOPROL-XL) 100 MG 24 hr tablet Take 100 mg by mouth daily. 01/06/18  Yes [provider]  montelukast (SINGULAIR) 10 MG tablet Take 10 mg by mouth at bedtime.  01/16/19  Yes [provider]  sertraline (ZOLOFT) 50 MG tablet Take 50 mg by mouth daily. 01/27/18  Yes [provider]  tamsulosin (FLOMAX) 0.4 MG CAPS capsule Take 0.4 mg by mouth daily. 03/12/18  Yes [provider]  ipratropium (ATROVENT) 0.06 % nasal spray Place 2 sprays into both nostrils 4 (four) times daily. Patient not taking: Reported on 02/08/2019 01/17/19   [name], MD  Family History Family History  Problem Relation Age of Onset  . Allergic rhinitis Father   . Urticaria Neg Hx   . Immunodeficiency Neg Hx   . Eczema Neg Hx   . Atopy Neg Hx   . Asthma Neg Hx   . Angioedema Neg Hx     Social History Social History   Tobacco Use  . Smoking status: Former Research scientist (life sciences)  . Smokeless tobacco: Never Used  Substance Use Topics  . Alcohol use: Yes    Alcohol/week: 3.0 standard drinks    Types: 3 Standard drinks or equivalent per week    Frequency: Never    Comment: drinks martinis about 3x/week  . Drug use: Never     Allergies   Levaquin [levofloxacin in d5w]; Morphine and related; Promethazine; and  Sulfa antibiotics   Review of Systems Review of Systems  Constitutional: Positive for diaphoresis and fatigue. Negative for chills and fever.  HENT: Negative for congestion and rhinorrhea.   Eyes: Negative for visual disturbance.  Respiratory: Negative for cough, shortness of breath and wheezing.   Cardiovascular: Positive for chest pain. Negative for leg swelling.  Gastrointestinal: Positive for abdominal pain, nausea and vomiting. Negative for diarrhea.  Genitourinary: Negative for dysuria and flank pain.  Musculoskeletal: Negative for neck pain and neck stiffness.  Skin: Negative for rash and wound.  Allergic/Immunologic: Negative for immunocompromised state.  Neurological: Positive for weakness. Negative for syncope and headaches.  All other systems reviewed and are negative.    Physical Exam Updated Vital Signs BP 108/62   Pulse 61   Temp 97.9 F (36.6 C) (Oral)   Resp 20   SpO2 95%   Physical Exam Vitals signs and nursing note reviewed.  Constitutional:      General: He is not in acute distress.    Appearance: He is well-developed.  HENT:     Head: Normocephalic and atraumatic.  Eyes:     Conjunctiva/sclera: Conjunctivae normal.  Neck:     Musculoskeletal: Neck supple.  Cardiovascular:     Rate and Rhythm: Normal rate and regular rhythm.     Heart sounds: Normal heart sounds. No murmur. No friction rub.  Pulmonary:     Effort: Pulmonary effort is normal. No respiratory distress.     Breath sounds: Normal breath sounds. No wheezing or rales.  Abdominal:     General: Abdomen is flat. There is no distension.     Palpations: Abdomen is soft.     Tenderness: There is abdominal tenderness in the right upper quadrant and epigastric area.  Skin:    General: Skin is warm.     Capillary Refill: Capillary refill takes less than 2 seconds.  Neurological:     Mental Status: He is alert and oriented to person, place, and time.     Motor: No abnormal muscle tone.       ED Treatments / Results  Labs (all labs ordered are listed, but only abnormal results are displayed) Labs Reviewed  COMPREHENSIVE METABOLIC PANEL - Abnormal; Notable for the following components:      Result Value   Potassium 3.4 (*)    Glucose, Bld 134 (*)    Total Protein 6.1 (*)    AST 14 (*)    All other components within normal limits  LACTIC ACID, PLASMA - Abnormal; Notable for the following components:   Lactic Acid, Venous 2.5 (*)    All other components within normal limits  CBC WITH DIFFERENTIAL/PLATELET  LIPASE, BLOOD  LACTIC ACID,  PLASMA  I-STAT TROPONIN, ED    EKG EKG Interpretation  Date/Time:  Thursday February 08 2019 02:41:25 EDT Ventricular Rate:  61 PR Interval:    QRS Duration: 97 QT Interval:  399 QTC Calculation: 402 R Axis:   -7 Text Interpretation:  Sinus rhythm Low voltage, precordial leads Minimal ST depression, inferior leads No significant change since last tracing Confirmed by Duffy Bruce [phone]) on 02/08/2019 2:44:48 AM   Radiology Dg Chest 2 View  Result Date: 02/08/2019 CLINICAL DATA:  c/o chest pain that woke him from sleep at midnight tonight. Pt also experienced nausea, one episode of emesis, and diaphoresis. Pt states chest pain has resolved and moved down into his abdomen. EXAM: CHEST - 2 VIEW COMPARISON:  09/01/2018 FINDINGS: Cardiac silhouette is borderline enlarged. No mediastinal or hilar masses. No evidence of adenopathy. Lungs are clear.  No pleural effusion or pneumothorax. Skeletal structures are intact. IMPRESSION: No active cardiopulmonary disease. Electronically Signed   By: Lajean Manes M.D.   On: 02/08/2019 03:41   Ct Abdomen Pelvis W Contrast  Result Date: 02/08/2019 CLINICAL DATA:  Abdominal pain with nausea and emesis EXAM: CT ABDOMEN AND PELVIS WITH CONTRAST TECHNIQUE: Multidetector CT imaging of the abdomen and pelvis was performed using the standard protocol following bolus administration of intravenous contrast.  CONTRAST:  141mL OMNIPAQUE IOHEXOL 300 MG/ML  SOLN COMPARISON:  CTA of the abdomen 03/28/2018 FINDINGS: Lower chest:  No contributory findings. Hepatobiliary: Extensive portal venous gas in the anti dependent liver. No primary hepatic abnormality. Cholecystectomywith no bile duct dilatation Pancreas: Unremarkable. Spleen: Unremarkable. Adrenals/Urinary Tract: 16 mm right adrenal nodule that is stable from 2019, consistent with adenoma. No hydronephrosis or stone. Bilateral renal cystic densities. Unremarkable bladder. Stomach/Bowel: There is extensive portal venous gas associated with vessels of the transverse colon and mid small bowel. The underlying bowel loops are not thickened and do not show clear pneumatosis. No pneumoperitoneum. Colonic diverticulosis. Vascular/Lymphatic: Atherosclerotic calcification. No acute arterial finding. Venous findings above. No mass or adenopathy. Reproductive:Enlarged prostate. Other: No ascites or pneumoperitoneum.  Fatty right inguinal hernia. Musculoskeletal: Advanced spinal degeneration and spinal stenosis. L3-4 degenerative anterolisthesis. These results were called by telephone at the time of interpretation on 02/08/2019 at 4:46 am to Dr. Duffy Bruce , who verbally acknowledged these results. IMPRESSION: 1. Extensive portal venous gas originating from transverse colon and a segment of small bowel. The underlying bowel loops do not appear diseased and hopefully this is benign pneumatosis. Recommend correlation with lactate and cereal abdominal exam to exclude ischemic bowel. 2. Otherwise stable from 2019 CT, as described. Electronically Signed   By: Monte Fantasia M.D.   On: 02/08/2019 04:46    Procedures Procedures (including critical care time)  Medications Ordered in ED Medications  ondansetron (ZOFRAN) injection 4 mg (4 mg Intravenous Refused 02/08/19 0419)  alum & mag hydroxide-simeth (MAALOX/MYLANTA) 200-200-20 MG/5ML suspension 30 mL (30 mLs Oral Given  02/08/19 0417)    And  lidocaine (XYLOCAINE) 2 % viscous mouth solution 15 mL (15 mLs Oral Given 02/08/19 0417)  iohexol (OMNIPAQUE) 300 MG/ML solution 125 mL (125 mLs Intravenous Contrast Given 02/08/19 0422)  0.9 %  sodium chloride infusion ( Intravenous New Bag/Given 02/08/19 0534)  fentaNYL (SUBLIMAZE) injection 50 mcg (50 mcg Intravenous Given 02/08/19 0536)  piperacillin-tazobactam (ZOSYN) IVPB 3.375 g (3.375 g Intravenous New Bag/Given 02/08/19 0657)     Initial Impression / Assessment and Plan / ED Course  I have reviewed the triage vital signs and the nursing notes.  Pertinent labs & imaging results that were available during my care of the patient were reviewed by me and considered in my medical decision making (see chart for details).        78 yo M here with acute onset chest and abd pain, now resolved. On arrival, VSS. Mild hypotension corrected w/ fluids. No fever. Lab work is overall initially reassuring, with normal WBC, normal renal function and LFTs. CT scan, however, is concerning for pneumatosis. Per Radiology, no signs of bowel ischemia itself but LA added on and is positive at 2.5. Will d/w Surgery. Pain resolved now.   Discussed with surgery - recommends ABX, serial exams. Will see pt as consult. Admit to medicine.  Final Clinical Impressions(s) / ED Diagnoses   Final diagnoses:  Pneumatosis intestinalis  Lactic acidosis    ED Discharge Orders    None       Duffy Bruce, MD 02/08/19 [phone]

## 2019-02-08 NOTE — H&P (Signed)
History and Physical  Benjamin Stanley ACZ:[id number] DOB: [birth year] DOA: 02/08/2019  PCP: [name], MD   Chief Complaint: chest pain  HPI:  80yom PMH cholecystectomy 03/2018, sigmoid colectomy 2013 presented with CP, abdominal pain, nausea, diaphoresis, vomiting. Labs stable except lactic acid 2.5 but CT concerning for pneumatosis and surgery consulted with initial rec for bowel rest, empiric abx and supportive care.  CP and right-sided abd pain began about 1 AM, 7-8/10 1 episode vomiting No SOB Fentanyl helped Feels a lot better now, no pain, no CP now; was located centrally eariler. No recent CP Ate fish yesterday, wonders if that is playing a role  COVID SCREEN Fever: NO  Cough: NO   SOB: NO URI symptoms: NO GI symptoms: YES, vomited once; better since BM Travel: NO Sick contacts: NO  ED Course: GI cocktail, Zosyn   Review of Systems:  Negative for fever, visual changes, sore throat, rash, new muscle aches, SOB, dysuria, bleeding   Past Medical History:  Diagnosis Date  . Asthma   . Colon cancer ([place])    s/p resection, stage I, recent colonoscopy negative 4 months ago  . Gastric ulcer   . Hyperlipidemia   . Hypertension     Past Surgical History:  Procedure Laterality Date  . CHOLECYSTECTOMY N/A 03/29/2018   Procedure: LAPAROSCOPIC CHOLECYSTECTOMY;  Surgeon: Greer Pickerel, MD;  Location: [facility];  Service: General;  Laterality: N/A;  . EXPLORATORY LAPAROTOMY W/ BOWEL RESECTION     sigmoid colectomy for colon cancer and colon perf after colonoscopy  . KNEE ARTHROSCOPY     several prior knee scopes in the remote past  . NASAL SINUS SURGERY     remote 20+yrs ago  . NO PAST SURGERIES    . REPLACEMENT TOTAL KNEE BILATERAL       reports that he has quit smoking. He has never used smokeless tobacco. He reports current alcohol use of about 3.0 standard drinks of alcohol per week. He reports that he does not use drugs.  Allergies  Allergen Reactions  .  Levaquin [Levofloxacin In D5w] Other (See Comments)    Joint pain and upset stomach   . Morphine And Related Nausea And Vomiting  . Promethazine Anxiety  . Sulfa Antibiotics Rash    Family History  Problem Relation Age of Onset  . Allergic rhinitis Father   . Urticaria Neg Hx   . Immunodeficiency Neg Hx   . Eczema Neg Hx   . Atopy Neg Hx   . Asthma Neg Hx   . Angioedema Neg Hx      Prior to Admission medications   Medication Sig Start Date End Date Taking? Authorizing Provider  albuterol (VENTOLIN HFA) 108 (90 Base) MCG/ACT inhaler Inhale 1-2 puffs into the lungs every 4 (four) hours as needed for wheezing.  08/01/18  Yes [provider]  atorvastatin (LIPITOR) 80 MG tablet Take 80 mg by mouth daily. 01/08/18  Yes [provider]  BREO ELLIPTA 100-25 MCG/INH AEPB Inhale 1 puff into the lungs daily.  01/05/19  Yes [provider]  hydrochlorothiazide (HYDRODIURIL) 12.5 MG tablet Take 25 mg by mouth daily. 12/05/18  Yes [provider]  losartan (COZAAR) 50 MG tablet Take 100 mg by mouth daily. 12/04/18  Yes [provider]  meloxicam (MOBIC) 15 MG tablet Take 15 mg by mouth daily.   Yes [provider]  metoprolol succinate (TOPROL-XL) 100 MG 24 hr tablet Take 100 mg by mouth daily. 01/06/18  Yes [provider]  montelukast (SINGULAIR) 10 MG tablet Take 10 mg by mouth at bedtime.  01/16/19  Yes [provider]  sertraline (ZOLOFT) 50 MG tablet Take 50 mg by mouth daily. 01/27/18  Yes [provider]  tamsulosin (FLOMAX) 0.4 MG CAPS capsule Take 0.4 mg by mouth daily. 03/12/18  Yes [provider]  ipratropium (ATROVENT) 0.06 % nasal spray Place 2 sprays into both nostrils 4 (four) times daily. Patient not taking: Reported on 02/08/2019 01/17/19   [name], MD    Physical Exam: Vitals:   02/08/19 0830 02/08/19 0900  BP: 127/69 126/72  Pulse: (!) 57 (!) 58  Resp: (!) 21 20  Temp:     SpO2: 100% 96%    Constitutional:   . Appears calm and comfortable Eyes:  . pupils and irises appear normal . Normal lids ENMT:  . grossly normal hearing  . Lips appear normal Neck:  . neck appears normal, no masses . no thyromegaly Respiratory:  . CTA bilaterally, no w/r/r.  . Respiratory effort normal.  Cardiovascular:  . RRR, no m/r/g . No LE extremity edema   Abdomen:  . Abdomen appears normal; no tenderness or masses, nondistended . No hepatomegaly noted Musculoskeletal:  . Digits/nails BUE: no clubbing, cyanosis, petechiae, infection . RUE, LUE, RLE, LLE   o strength and tone normal, no atrophy, no abnormal movements o No tenderness, masses Skin:  . No rashes, lesions, ulcers . palpation of skin: no induration or nodules Psychiatric:  . Mental status o Mood, affect appropriate . judgment and insight appear intact     I have personally reviewed following labs and imaging studies  Labs:   CMP and CBC unremarkable  Lactic acid recheck 1.8    Imaging studies:   CT abd/pelvis noted Extensive portal venous gas originating from transverse colon and a segment of small bowel. The underlying bowel loops do not appear diseased and hopefully this is benign pneumatosis. Recommend correlation with lactate and cereal abdominal exam to exclude ischemic bowel.  CXR independent review: NAD  Medical tests:   EKG independently reviewed: SR no acute changes    Active Problems:   * No active hospital problems. *   Assessment/Plan 78yom appears younger than stated age presented with CP and abd pain; CT abd/pelvis abnl.  Right-sided abd pain; resolved at this point --CT noted; etiology unclear --surgery eval noted; plan for abx and serial exams --pt appears well, lactic acid down to normal, no reason to suspect ischemic gut or catastrophic event --continue abx  Central CP --resolved, suspect related to abd pain --troponin negative, EKG nonacute --no evidence to  suggest ACS  Flu: not indicated RVP: not indicated CBC: NO leukopenia, lymphopenia   BMP:  Normal BUN/Cr  LFTs: increased AST/ALT/Tbili NO CRP, LDH: not checked Procalcitonin: not checked  CXR: clear  COVID subjective risk assessment: LOW Physician PPE: surgical mask initially; no strong indication for further PPE currently Patient PPE: none   COVID Testing: not indicated per current ID/[place] guidelines   Severity of Illness: The appropriate patient status for this patient is OBSERVATION. Observation status is judged to be reasonable and necessary in order to provide the required intensity of service to ensure the patient's safety. The patient's presenting symptoms, physical exam findings, and initial radiographic and laboratory data in the context of their medical condition is felt to place them at decreased risk for further clinical deterioration. Furthermore, it is anticipated that the patient will be medically stable for discharge from the hospital  within 2 midnights of admission. The following factors support the patient status of observation.   " The patient's presenting symptoms include chest pain, abd pain. " The physical exam findings include benign exam. " The initial radiographic and laboratory data are portal vein gas   DVT prophylaxis:SCDs Code Status: Full Family Communication: none Consults called: none, surgery already seeing    Time spent: 60 minutes  Murray Hodgkins, MD  [facility] Direct contact: see [email or web address]  7PM-7AM contact night coverage as below   1. Check the care team in [facility] and look for a) attending/consulting [facility] provider listed and b) the [facility] team listed 2. Log into [email or web address] and use [facility] universal password to access. If you do not have the password, please contact the hospital operator. 3. Locate the [facility] provider you are looking for under [facility] and page to a number that you can be directly reached. 4. If  you still have difficulty reaching the provider, please page the [facility] (Director on Call) for the Hospitalists listed on amion for assistance.   02/08/2019, 10:53 AM

## 2019-02-08 NOTE — ED Triage Notes (Signed)
Pt BIB [facility] from home, c/o chest pain that woke him from sleep at midnight tonight. Pt also experienced nausea, one episode of emesis, and diaphoresis. Pt took 81mg  asa before EMS arrival. En route, chest pain resolved and pt developed abdominal pain. Gallbladder removed. Given 4mg  zofran by EMS. EMS VSS.

## 2019-02-09 DIAGNOSIS — K6389 Other specified diseases of intestine: Secondary | ICD-10-CM

## 2019-02-09 DIAGNOSIS — R1084 Generalized abdominal pain: Secondary | ICD-10-CM

## 2019-02-09 DIAGNOSIS — E876 Hypokalemia: Secondary | ICD-10-CM

## 2019-02-09 DIAGNOSIS — M1990 Unspecified osteoarthritis, unspecified site: Secondary | ICD-10-CM | POA: Diagnosis not present

## 2019-02-09 DIAGNOSIS — R109 Unspecified abdominal pain: Secondary | ICD-10-CM | POA: Diagnosis not present

## 2019-02-09 DIAGNOSIS — E785 Hyperlipidemia, unspecified: Secondary | ICD-10-CM

## 2019-02-09 DIAGNOSIS — E872 Acidosis: Secondary | ICD-10-CM

## 2019-02-09 DIAGNOSIS — I159 Secondary hypertension, unspecified: Secondary | ICD-10-CM

## 2019-02-09 DIAGNOSIS — R110 Nausea: Secondary | ICD-10-CM | POA: Diagnosis not present

## 2019-02-09 DIAGNOSIS — Z8719 Personal history of other diseases of the digestive system: Secondary | ICD-10-CM | POA: Diagnosis not present

## 2019-02-09 DIAGNOSIS — R0789 Other chest pain: Secondary | ICD-10-CM | POA: Diagnosis not present

## 2019-02-09 DIAGNOSIS — R1010 Upper abdominal pain, unspecified: Secondary | ICD-10-CM | POA: Diagnosis not present

## 2019-02-09 LAB — COMPREHENSIVE METABOLIC PANEL
ALT: 20 U/L (ref 0–44)
AST: 16 U/L (ref 15–41)
Albumin: 3.4 g/dL — ABNORMAL LOW (ref 3.5–5.0)
Alkaline Phosphatase: 57 U/L (ref 38–126)
Anion gap: 7 (ref 5–15)
BUN: 12 mg/dL (ref 8–23)
CO2: 25 mmol/L (ref 22–32)
Calcium: 8.4 mg/dL — ABNORMAL LOW (ref 8.9–10.3)
Chloride: 102 mmol/L (ref 98–111)
Creatinine, Ser: 1.03 mg/dL (ref 0.61–1.24)
GFR calc Af Amer: >60 mL/min (ref >60)
GFR calc non Af Amer: >60 mL/min (ref >60)
Glucose, Bld: 99 mg/dL (ref 70–99)
Potassium: 3.5 mmol/L (ref 3.5–5.1)
Sodium: 134 mmol/L — ABNORMAL LOW (ref 135–145)
Total Bilirubin: 0.9 mg/dL (ref 0.3–1.2)
Total Protein: 5.9 g/dL — ABNORMAL LOW (ref 6.5–8.1)

## 2019-02-09 LAB — CBC
HCT: 39 % (ref 39.0–52.0)
Hemoglobin: 13.2 g/dL (ref 13.0–17.0)
MCH: 31.1 pg (ref 26.0–34.0)
MCHC: 33.8 g/dL (ref 30.0–36.0)
MCV: 92 fL (ref 80.0–100.0)
Platelets: 171 10*3/uL (ref 150–400)
RBC: 4.24 MIL/uL (ref 4.22–5.81)
RDW: 12.5 % (ref 11.5–15.5)
WBC: 9 10*3/uL (ref 4.0–10.5)
nRBC: 0 % (ref 0.0–0.2)

## 2019-02-09 MED ORDER — AMOXICILLIN-POT CLAVULANATE 875-125 MG PO TABS: 1.0000 | ORAL_TABLET | Freq: Two times a day (BID) | ORAL | 0 refills | Status: DC

## 2019-02-09 MED ORDER — AMOXICILLIN-POT CLAVULANATE 875-125 MG PO TABS
1.0000 | ORAL_TABLET | Freq: Two times a day (BID) | ORAL | Status: DC
  Administered 2019-02-09: 1 via ORAL
  Filled 2019-02-09: qty 1

## 2019-02-09 MED ORDER — SODIUM CHLORIDE 0.9 % IV SOLN
3.0000 g | Freq: Four times a day (QID) | INTRAVENOUS | Status: DC
  Filled 2019-02-09: qty 3

## 2019-02-09 NOTE — Progress Notes (Signed)
PHARMACY NOTE:  ANTIMICROBIAL RENAL DOSAGE ADJUSTMENT  Current antimicrobial regimen includes a mismatch between antimicrobial dosage and estimated renal function.  As per policy approved by the Pharmacy & Therapeutics and Medical Executive Committees, the antimicrobial dosage will be adjusted accordingly.  Current antimicrobial dosage:  Unasyn 1.5gm IV q6h  Indication: Intra-abdominal infection  Renal Function: est CrCl ~55ml/min     Antimicrobial dosage has been changed to: Unasyn 3 gm IV q6h  Additional comments:  Brianny Soulliere A. Levada Dy, PharmD, [place] Pager: [phone] Please utilize Amion for appropriate phone number to reach the unit pharmacist ([place])    02/09/2019 8:28 AM

## 2019-02-09 NOTE — Discharge Summary (Signed)
Benjamin Stanley, is a 78 y.o. male  DOB 03-11-1941  MRN [id number].  Admission date:  02/08/2019  Admitting Physician  Samuella Cota, MD  Discharge Date:  02/09/2019   Primary MD  [name], MD  Recommendations for primary care physician for things to follow:     Discharge Diagnosis Principal Problem:   Right sided abdominal pain Active Problems:   Chest pain   Hypertension   History of gastric ulcer   Hyperlipidemia   Abdominal pain, generalized   Pneumatosis intestinalis   Hypokalemia   Osteoarthritis      Past Medical History:  Diagnosis Date   Asthma    Colon cancer ([place])    s/p resection, stage I, recent colonoscopy negative 4 months ago   Gastric ulcer    Hyperlipidemia    Hypertension     Past Surgical History:  Procedure Laterality Date   CHOLECYSTECTOMY N/A 03/29/2018   Procedure: LAPAROSCOPIC CHOLECYSTECTOMY;  Surgeon: Greer Pickerel, MD;  Location: [facility];  Service: General;  Laterality: N/A;   EXPLORATORY LAPAROTOMY W/ BOWEL RESECTION     sigmoid colectomy for colon cancer and colon perf after colonoscopy   KNEE ARTHROSCOPY     several prior knee scopes in the remote past   NASAL SINUS SURGERY     remote 20+yrs ago   REPLACEMENT TOTAL KNEE BILATERAL         HPI  from the history and physical done on the day of admission:   Benjamin Stanley is a 78 year old male with past medical history significant for cholecystectomy 03/2018, sigmoid colectomy 2013 presented with CP, abdominal pain, nausea, diaphoresis, vomiting. Labs stable except lactic acid 2.5 but CT concerning for pneumatosis and surgery consulted with initial rec for bowel rest, empiric abx and supportive care.  CP and right-sided abd pain began about 1 AM, 7-8/10 1 episode vomiting No SOB Fentanyl  helped Feels a lot better now, no pain, no CP now; was located centrally eariler. No recent CP Ate fish yesterday, wonders if that is playing a role  COVID SCREEN Fever: NO  Cough: NO   SOB: NO URI symptoms: NO GI symptoms: YES, vomited once; better since BM Travel: NO Sick contacts: NO  ED Course: GI cocktail, Zosyn    Hospital Course:   1.  Right-sided abdominal pain 2/2 pneumatosis intestinalis: Acute.  Patient presented with complaints of abdominal/chest pain.  CT imaging showing pneumatosis of the transverse colon and segment of small bowel.  Due to the elevated lactic acid patient was initially treated with IV fluids and empiric antibiotic antibiotics of Zosyn and changed to Unasyn IV.  General surgery have been consulted and suspected that this was a benign process recommended conservative management.  Symptoms resolved without need of surgical intervention.  Patient was able to tolerate diet and had 2 normal nonbloody bowel movements.  On the differential included gastritis/PUD from history of meloxicam versus possibility of ischemic colitis, but appears less likely.  Discussed with patient that he may need to refrain from utilizing  meloxicam.  2.  Chest pain: Symptoms of chest pain resolved after admission and thought were likely related to abdominal pain .  Troponin noted to be negative with no other significant evidence to suggest ACS.  Chest x-ray otherwise clear.  3.  Lactic acidosis: Resolved.  Admission lactic acid elevated 2.5, but within normal limits after initial therapies.  No other symptoms to suggest infection noted.  4.  Essential hypertension: Blood pressures remained stable.  Patient continued on home medications of losartan, metoprolol, and hydrochlorothiazide.  5.  Hyperlipidemia: Continued on atorvastatin.  No significant liver abnormalities noted.  6.  Osteoarthritis:Patient takes meloxicam daily for last year or so for symptoms.  Reports Tylenol provides no  relief.  Question if symptoms were related to gastritis/PUD.  Advised patient is to refrain from use and discuss alternatives with primary care given he has a history of peptic ulcer disease in the past.  7.  OSA on CPAP: Patient continued on CPAP overnight.  8.  Hypokalemia: Resolved.  Admission potassium noted mid mildly low at 3.4, repeat check within normal limits at 3.5 prior to discharge the following day.  9.  BPH: Stable.  Patient denied any complaints.  Continued Flomax.  10.  GERD/history of peptic ulcer disease: Patient not on a PPI at home.  Consider starting PPI in outpatient setting possibly.  Follow UP     Consults obtained: General surgery  Discharge Condition: Stable  Diet and Activity recommendation: See Discharge Instructions below  Discharge Instructions    Discharge instructions   Complete by:  As directed    Follow-up with your primary Dr. Darlina Sicilian within 7 to 10 days.  You were found to have a lot of gas within your intestines.  Recommending completing the course of antibiotics that will be sent to your pharmacy for the possibility of infection although this was not clearly seen.  Advise you to NOT take meloxicam as this can also cause ulcers in your stomach, could be another cause for your abdominal pain symptoms, or put you at increased risk for heart attack/stroke.  Consider taking Tylenol instead for pain or discussing alternatives with your primary care provider for treatment of your arthritis pain.  Being placed on an acid reflux medication may also be helpful.   CBC and CMP -  checked  by Primary MD or SNF MD in 5-7 days ( we routinely change or add medications that can affect your baseline labs and fluid status, therefore we recommend that you get the mentioned basic workup next visit with your PCP, your PCP may decide not to get them or add new tests based on their clinical decision)  Activity: As tolerated  Disposition: Home   Diet: Heart  Healthy   Special Instructions: If you have smoked or chewed Tobacco  in the last 2 yrs please stop smoking, stop any regular Alcohol  and or any Recreational drug use.  On your next visit with your primary care physician please Get Medicines reviewed and adjusted.  Please request your [name], MD to go over all Hospital Tests and Procedure/Radiological results at the follow up, please get all Hospital records sent to your Prim MD by signing hospital release before you go home.  If you experience worsening of your admission symptoms, develop shortness of breath, life threatening emergency, suicidal or homicidal thoughts you must seek medical attention immediately by calling 911 or calling your MD immediately  if symptoms less severe.  You Must read complete instructions/literature along with all  the possible adverse reactions/side effects for all the Medicines you take and that have been prescribed to you. Take any new Medicines after you have completely understood and accpet all the possible adverse reactions/side effects.   Do not drive, operate heavy machinery, perform activities at heights, swimming or participation in water activities or provide baby sitting services if your were admitted for syncope or siezures until you have seen by Primary MD or a Neurologist and advised to do so again.  Do not drive when taking Pain medications.  Do not take more than prescribed Pain, Sleep and Anxiety Medications  Wear Seat belts while driving.   Please note  You were cared for by a hospitalist during your hospital stay. If you have any questions about your discharge medications or the care you received while you were in the hospital after you are discharged, you can call the unit and asked to speak with the hospitalist on call if the hospitalist that took care of you is not available. Once you are discharged, your primary care physician will handle any further medical issues. Please  note that NO REFILLS for any discharge medications will be authorized once you are discharged, as it is imperative that you return to your primary care physician (or establish a relationship with a primary care physician if you do not have one) for your aftercare needs so that they can reassess your need for medications and monitor your lab values.        Discharge Medications     Allergies as of 02/09/2019      Reactions   Levaquin [levofloxacin In D5w] Other (See Comments)   Joint pain and upset stomach   Morphine And Related Nausea And Vomiting   Promethazine Anxiety   Sulfa Antibiotics Rash      Medication List    STOP taking these medications   ipratropium 0.06 % nasal spray Commonly known as:  Atrovent   meloxicam 15 MG tablet Commonly known as:  MOBIC     TAKE these medications   amoxicillin-clavulanate 875-125 MG tablet Commonly known as:  AUGMENTIN Take 1 tablet by mouth every 12 (twelve) hours.   atorvastatin 80 MG tablet Commonly known as:  LIPITOR Take 80 mg by mouth daily.   Breo Ellipta 100-25 MCG/INH Aepb Generic drug:  fluticasone furoate-vilanterol Inhale 1 puff into the lungs daily.   hydrochlorothiazide 12.5 MG tablet Commonly known as:  HYDRODIURIL Take 25 mg by mouth daily.   losartan 50 MG tablet Commonly known as:  COZAAR Take 100 mg by mouth daily.   metoprolol succinate 100 MG 24 hr tablet Commonly known as:  TOPROL-XL Take 100 mg by mouth daily.   montelukast 10 MG tablet Commonly known as:  SINGULAIR Take 10 mg by mouth at bedtime.   sertraline 50 MG tablet Commonly known as:  ZOLOFT Take 50 mg by mouth daily.   tamsulosin 0.4 MG Caps capsule Commonly known as:  FLOMAX Take 0.4 mg by mouth daily.   Ventolin HFA 108 (90 Base) MCG/ACT inhaler Generic drug:  albuterol Inhale 1-2 puffs into the lungs every 4 (four) hours as needed for wheezing.       Major procedures and Radiology Reports - PLEASE review detailed and final  reports for all details, in brief -      Dg Chest 2 View  Result Date: 02/08/2019 CLINICAL DATA:  c/o chest pain that woke him from sleep at midnight tonight. Pt also experienced nausea, one episode of emesis, and  diaphoresis. Pt states chest pain has resolved and moved down into his abdomen. EXAM: CHEST - 2 VIEW COMPARISON:  09/01/2018 FINDINGS: Cardiac silhouette is borderline enlarged. No mediastinal or hilar masses. No evidence of adenopathy. Lungs are clear.  No pleural effusion or pneumothorax. Skeletal structures are intact. IMPRESSION: No active cardiopulmonary disease. Electronically Signed   By: Lajean Manes M.D.   On: 02/08/2019 03:41   Ct Abdomen Pelvis W Contrast  Result Date: 02/08/2019 CLINICAL DATA:  Abdominal pain with nausea and emesis EXAM: CT ABDOMEN AND PELVIS WITH CONTRAST TECHNIQUE: Multidetector CT imaging of the abdomen and pelvis was performed using the standard protocol following bolus administration of intravenous contrast. CONTRAST:  12mL OMNIPAQUE IOHEXOL 300 MG/ML  SOLN COMPARISON:  CTA of the abdomen 03/28/2018 FINDINGS: Lower chest:  No contributory findings. Hepatobiliary: Extensive portal venous gas in the anti dependent liver. No primary hepatic abnormality. Cholecystectomywith no bile duct dilatation Pancreas: Unremarkable. Spleen: Unremarkable. Adrenals/Urinary Tract: 16 mm right adrenal nodule that is stable from 2019, consistent with adenoma. No hydronephrosis or stone. Bilateral renal cystic densities. Unremarkable bladder. Stomach/Bowel: There is extensive portal venous gas associated with vessels of the transverse colon and mid small bowel. The underlying bowel loops are not thickened and do not show clear pneumatosis. No pneumoperitoneum. Colonic diverticulosis. Vascular/Lymphatic: Atherosclerotic calcification. No acute arterial finding. Venous findings above. No mass or adenopathy. Reproductive:Enlarged prostate. Other: No ascites or pneumoperitoneum.  Fatty  right inguinal hernia. Musculoskeletal: Advanced spinal degeneration and spinal stenosis. L3-4 degenerative anterolisthesis. These results were called by telephone at the time of interpretation on 02/08/2019 at 4:46 am to Dr. Duffy Bruce , who verbally acknowledged these results. IMPRESSION: 1. Extensive portal venous gas originating from transverse colon and a segment of small bowel. The underlying bowel loops do not appear diseased and hopefully this is benign pneumatosis. Recommend correlation with lactate and cereal abdominal exam to exclude ischemic bowel. 2. Otherwise stable from 2019 CT, as described. Electronically Signed   By: Monte Fantasia M.D.   On: 02/08/2019 04:46    Micro Results    No results found for this or any previous visit (from the past 240 hour(s)).     Today   Subjective    Padraic Putnam today states that he no longer has had any abdominal pain or chest pain.  He was able to eat, passing gas, and has had 2 normal bowel movements today.  Reports that he has been taking meloxicam for the last year for arthritis pain and that Tylenol does not help.  Objective   Blood pressure 121/70, pulse 60, temperature 97.6 F (36.4 C), temperature source Oral, resp. rate 18, SpO2 93 %.   Intake/Output Summary (Last 24 hours) at 02/09/2019 1048 Last data filed at 02/09/2019 0836 Gross per 24 hour  Intake 820 ml  Output --  Net 820 ml    Exam  Constitutional: Obese elderly male in NAD, calm, comfortable Eyes: PERRL, lids and conjunctivae normal ENMT: Mucous membranes are moist. Posterior pharynx clear of any exudate or lesions.  Neck: normal, supple, no masses, no thyromegaly Respiratory: clear to auscultation bilaterally, no wheezing, no crackles. Normal respiratory effort. No accessory muscle use.  Cardiovascular: Regular rate and rhythm, no murmurs / rubs / gallops. No extremity edema. 2+ pedal pulses. No carotid bruits.  Abdomen: Protuberant abdomen, no  tenderness, no masses palpated. No hepatosplenomegaly. Bowel sounds positive.  Musculoskeletal: no clubbing / cyanosis. No joint deformity upper and lower extremities. Good ROM, no contractures. Normal muscle  tone.  Skin: no rashes, lesions, ulcers. No induration Neurologic: CN 2-12 grossly intact. Sensation intact, DTR normal. Strength 5/5 in all 4.  Psychiatric: Normal judgment and insight. Alert and oriented x 3. Normal mood.    Data Review   CBC w Diff:  Lab Results  Component Value Date   WBC 9.0 02/09/2019   HGB 13.2 02/09/2019   HCT 39.0 02/09/2019   PLT 171 02/09/2019   LYMPHOPCT 13 02/08/2019   MONOPCT 7 02/08/2019   EOSPCT 2 02/08/2019   BASOPCT 1 02/08/2019    CMP:  Lab Results  Component Value Date   NA 134 (L) 02/09/2019   K 3.5 02/09/2019   CL 102 02/09/2019   CO2 25 02/09/2019   BUN 12 02/09/2019   CREATININE 1.03 02/09/2019   PROT 5.9 (L) 02/09/2019   ALBUMIN 3.4 (L) 02/09/2019   BILITOT 0.9 02/09/2019   ALKPHOS 57 02/09/2019   AST 16 02/09/2019   ALT 20 02/09/2019  .   Total Time in preparing paper work, data evaluation and todays exam - 35 minutes  Norval Morton M.D on 02/09/2019 at 10:48 AM  [facility]   Office  [phone]

## 2019-02-09 NOTE — Final Consult Note (Signed)
Consultant Final Sign-Off Note    Assessment/Final recommendations  Benjamin Stanley is a 78 y.o. male followed by general surgery for transient episode of upper abdominal pain and nausea with a CT scan demonstrates portal venous gas thought to originate from the transverse colon and a segment of small bowel. He was evaluated yesterday with a benign abdominal exam. His initial workup he was with no fever, no tachycardia, no hypotension, no acidosis or acute kidney injury, no leukocytosis, and his lactic acid level although initially elevated cleared from 2.5 to 1.8. Overnight the patient's vitals have been without fever, tachycardia or hypotension. CBC without leukocytosis. He has tolerated a normal diet since noon yesterday without abdominal pain, N/V. He has continued to pass flatus and reports normal BM without blood or melena last night. He abdominal exam today remains benign. There is no indication for surgical management. We will sign off.   Wound care (if applicable):    Diet at discharge: per primary team   Activity at discharge: per primary team   Follow-up appointment:  As needed    Pending results:  Unresulted Labs (From admission, onward)    Start     Ordered   02/09/19 0721  Troponin I - Add-On to previous collection  Add-on,   R    Question:  Specimen collection method  Answer:  Lab=Lab collect   02/09/19 0720           Medication recommendations:   Other recommendations:    Thank you for allowing [place] to participate in the care of your patient!  Please consult [place] again if you have further needs for your patient.  Barth Kirks [facility] 02/09/2019 8:31 AM    Subjective   Patient reports he did well overnight. Has been on regular diet since noon yesterday. No abdominal pain, N/V. Passing flatus and had a normal BM last night. Reports he is "functionally distended" and does not feel his abdomen is distended from his baseline.   Objective  Vital signs in last 24  hours: Temp:  [98 F (36.7 C)-98.3 F (36.8 C)] 98 F (36.7 C) (03/20 0514) Pulse Rate:  [55-61] 61 (03/20 0744) Resp:  [16-20] 18 (03/20 0744) BP: (102-143)/(55-77) 118/72 (03/20 0514) SpO2:  [94 %-96 %] 96 % (03/20 0744)  Physical exam:  Gen: Awake and alert, eating breakfast Heart: Regular Lungs: Normal effort Abd: Soft, mild distension, non-tender to light and deep palpation. No r/r/g or signs of peritonitis. +BS   Pertinent labs and Studies: Recent Labs    02/08/19 0304 02/09/19 0227  WBC 8.8 9.0  HGB 13.4 13.2  HCT 39.9 39.0   BMET Recent Labs    02/08/19 0304 02/09/19 0227  NA 135 134*  K 3.4* 3.5  CL 102 102  CO2 23 25  GLUCOSE 134* 99  BUN 11 12  CREATININE 0.82 1.03  CALCIUM 8.9 8.4*   No results for input(s): LABURIN in the last 72 hours. Results for orders placed or performed during the hospital encounter of 03/28/18  MRSA PCR Screening     Status: None   Collection Time: 03/28/18  6:32 PM  Result Value Ref Range Status   MRSA by PCR NEGATIVE NEGATIVE Final    Comment:        The GeneXpert MRSA Assay (FDA approved for NASAL specimens only), is one component of a comprehensive MRSA colonization surveillance program. It is not intended to diagnose MRSA infection nor to guide or monitor treatment for MRSA infections. Performed at  [facility], [place] [address] [place], [place] [address]     Imaging: No results found.

## 2019-02-09 NOTE — Progress Notes (Signed)
NURSING PROGRESS NOTE  Benjamin Stanley [id number] Discharge Data: 02/09/2019 4:01 PM Attending Provider: No att. providers found EOF:[id number], Jimmy Picket, MD     [name] to be D/C'd Home per MD order.  Discussed with the patient the After Visit Summary and all questions fully answered. All IV's discontinued with no bleeding noted. All belongings returned to patient for patient to take home.   Last Vital Signs:  Blood pressure 121/70, pulse 60, temperature 97.6 F (36.4 C), temperature source Oral, resp. rate 18, SpO2 93 %.  Discharge Medication List Allergies as of 02/09/2019      Reactions   Levaquin [levofloxacin In D5w] Other (See Comments)   Joint pain and upset stomach   Morphine And Related Nausea And Vomiting   Promethazine Anxiety   Sulfa Antibiotics Rash      Medication List    STOP taking these medications   ipratropium 0.06 % nasal spray Commonly known as:  Atrovent   meloxicam 15 MG tablet Commonly known as:  MOBIC     TAKE these medications   amoxicillin-clavulanate 875-125 MG tablet Commonly known as:  AUGMENTIN Take 1 tablet by mouth every 12 (twelve) hours.   atorvastatin 80 MG tablet Commonly known as:  LIPITOR Take 80 mg by mouth daily.   Breo Ellipta 100-25 MCG/INH Aepb Generic drug:  fluticasone furoate-vilanterol Inhale 1 puff into the lungs daily.   hydrochlorothiazide 12.5 MG tablet Commonly known as:  HYDRODIURIL Take 25 mg by mouth daily.   losartan 50 MG tablet Commonly known as:  COZAAR Take 100 mg by mouth daily.   metoprolol succinate 100 MG 24 hr tablet Commonly known as:  TOPROL-XL Take 100 mg by mouth daily.   montelukast 10 MG tablet Commonly known as:  SINGULAIR Take 10 mg by mouth at bedtime.   sertraline 50 MG tablet Commonly known as:  ZOLOFT Take 50 mg by mouth daily.   tamsulosin 0.4 MG Caps capsule Commonly known as:  FLOMAX Take 0.4 mg by mouth daily.   Ventolin HFA 108 (90 Base) MCG/ACT  inhaler Generic drug:  albuterol Inhale 1-2 puffs into the lungs every 4 (four) hours as needed for wheezing.

## 2019-02-11 DIAGNOSIS — M1990 Unspecified osteoarthritis, unspecified site: Secondary | ICD-10-CM | POA: Diagnosis present

## 2019-02-11 DIAGNOSIS — K6389 Other specified diseases of intestine: Secondary | ICD-10-CM | POA: Diagnosis present

## 2019-02-11 DIAGNOSIS — E876 Hypokalemia: Secondary | ICD-10-CM | POA: Diagnosis not present

## 2019-03-05 DIAGNOSIS — L03032 Cellulitis of left toe: Secondary | ICD-10-CM | POA: Diagnosis not present

## 2019-03-12 DIAGNOSIS — F331 Major depressive disorder, recurrent, moderate: Secondary | ICD-10-CM | POA: Diagnosis not present

## 2019-03-12 DIAGNOSIS — I700 Atherosclerosis of aorta: Secondary | ICD-10-CM | POA: Diagnosis not present

## 2019-03-12 DIAGNOSIS — J449 Chronic obstructive pulmonary disease, unspecified: Secondary | ICD-10-CM | POA: Diagnosis not present

## 2019-03-12 DIAGNOSIS — C189 Malignant neoplasm of colon, unspecified: Secondary | ICD-10-CM | POA: Diagnosis not present

## 2019-03-12 DIAGNOSIS — I10 Essential (primary) hypertension: Secondary | ICD-10-CM | POA: Diagnosis not present

## 2019-03-12 DIAGNOSIS — I471 Supraventricular tachycardia: Secondary | ICD-10-CM | POA: Diagnosis not present

## 2019-03-12 DIAGNOSIS — I701 Atherosclerosis of renal artery: Secondary | ICD-10-CM | POA: Diagnosis not present

## 2019-03-12 DIAGNOSIS — Z7189 Other specified counseling: Secondary | ICD-10-CM | POA: Diagnosis not present

## 2019-03-12 DIAGNOSIS — E278 Other specified disorders of adrenal gland: Secondary | ICD-10-CM | POA: Diagnosis not present

## 2019-03-12 DIAGNOSIS — K580 Irritable bowel syndrome with diarrhea: Secondary | ICD-10-CM | POA: Diagnosis not present

## 2019-03-20 DIAGNOSIS — G4733 Obstructive sleep apnea (adult) (pediatric): Secondary | ICD-10-CM | POA: Diagnosis not present

## 2019-04-25 DIAGNOSIS — I700 Atherosclerosis of aorta: Secondary | ICD-10-CM | POA: Diagnosis not present

## 2019-04-25 DIAGNOSIS — K915 Postcholecystectomy syndrome: Secondary | ICD-10-CM | POA: Diagnosis not present

## 2019-04-25 DIAGNOSIS — I701 Atherosclerosis of renal artery: Secondary | ICD-10-CM | POA: Diagnosis not present

## 2019-04-25 DIAGNOSIS — I10 Essential (primary) hypertension: Secondary | ICD-10-CM | POA: Diagnosis not present

## 2019-04-25 DIAGNOSIS — C189 Malignant neoplasm of colon, unspecified: Secondary | ICD-10-CM | POA: Diagnosis not present

## 2019-04-25 DIAGNOSIS — G4733 Obstructive sleep apnea (adult) (pediatric): Secondary | ICD-10-CM | POA: Diagnosis not present

## 2019-04-25 DIAGNOSIS — F331 Major depressive disorder, recurrent, moderate: Secondary | ICD-10-CM | POA: Diagnosis not present

## 2019-04-25 DIAGNOSIS — K219 Gastro-esophageal reflux disease without esophagitis: Secondary | ICD-10-CM | POA: Diagnosis not present

## 2019-04-25 DIAGNOSIS — J449 Chronic obstructive pulmonary disease, unspecified: Secondary | ICD-10-CM | POA: Diagnosis not present

## 2019-04-25 DIAGNOSIS — N401 Enlarged prostate with lower urinary tract symptoms: Secondary | ICD-10-CM | POA: Diagnosis not present

## 2019-04-25 DIAGNOSIS — M109 Gout, unspecified: Secondary | ICD-10-CM | POA: Diagnosis not present

## 2019-04-27 DIAGNOSIS — Z136 Encounter for screening for cardiovascular disorders: Secondary | ICD-10-CM | POA: Diagnosis not present

## 2019-04-27 DIAGNOSIS — I700 Atherosclerosis of aorta: Secondary | ICD-10-CM | POA: Diagnosis not present

## 2019-04-27 DIAGNOSIS — C189 Malignant neoplasm of colon, unspecified: Secondary | ICD-10-CM | POA: Diagnosis not present

## 2019-04-27 DIAGNOSIS — M1A9XX0 Chronic gout, unspecified, without tophus (tophi): Secondary | ICD-10-CM | POA: Diagnosis not present

## 2019-05-17 ENCOUNTER — Ambulatory Visit: Payer: Medicare Other | Admitting: Allergy

## 2019-05-17 ENCOUNTER — Encounter: Payer: Self-pay | Admitting: Allergy

## 2019-05-17 ENCOUNTER — Ambulatory Visit (INDEPENDENT_AMBULATORY_CARE_PROVIDER_SITE_OTHER): Payer: Medicare Other | Admitting: Allergy

## 2019-05-17 ENCOUNTER — Other Ambulatory Visit: Payer: Self-pay

## 2019-05-17 DIAGNOSIS — J3089 Other allergic rhinitis: Secondary | ICD-10-CM

## 2019-05-17 DIAGNOSIS — J4530 Mild persistent asthma, uncomplicated: Secondary | ICD-10-CM | POA: Diagnosis not present

## 2019-05-17 MED ORDER — ARNUITY ELLIPTA 200 MCG/ACT IN AEPB: 1.0000 | INHALATION_SPRAY | Freq: Every day | RESPIRATORY_TRACT | 5 refills | Status: DC

## 2019-05-17 NOTE — Patient Instructions (Addendum)
Allergic rhinitis  -  Environmental allergy panel showed sensitivity to molds.  Continue avoidance measures for molds.  -  Continue use of Flonase 1 spray twice a day for control of nasal congestion  -  Continue Singulair 10mg  daily - take at bedtime  -  If needed may take an antihistamine like Zyrtec, Allegra or Xyzal  Asthma, allergic driven  - Symptoms have been well controlled and will stepdown therapy  - Stop Breo and change to Arnuity 200 - 1 puff daily  -  If you have return of asthma symptoms or you are not meeting the below goals then go back up to [facility] as this was controlling symptoms very well and notify our office.    - Singulair as above  - have access to albuterol inhaler 2 puffs every 4-6 hours as needed for cough/wheeze/shortness of breath/chest tightness.  May use 15-20 minutes prior to activity.   Monitor frequency of use.   Control goals:   Full participation in all desired activities (may need albuterol before activity)  Albuterol use two time or less a week on average (not counting use with activity)  Cough interfering with sleep two time or less a month  Oral steroids no more than once a year  No hospitalizations   Follow-up 3-4 months or sooner if needed

## 2019-05-17 NOTE — Progress Notes (Signed)
RE: Benjamin Stanley MRN: [id number] DOB: February 06, 1941 Date of Telemedicine Visit: 05/17/2019  Referring provider: [name]* Primary care provider: [name], MD  Chief Complaint: Sinusitis   Telemedicine Follow Up Visit via Telephone: I connected with Benjamin Stanley for a follow up on 05/17/19 by telephone and verified that I am speaking with the correct person using two identifiers.   I discussed the limitations, risks, security and privacy concerns of performing an evaluation and management service by telephone and the availability of in person appointments. I also discussed with the patient that there may be a patient responsible charge related to this service. The patient expressed understanding and agreed to proceed.  Patient is at home..  Provider is at the office.  Visit start time: 1004 Visit end time: 1024 Insurance consent/check in by: [name] consent and medical assistant/nurse: Adele Dan  History of Present Illness: He is a 78 y.o. male, who is being followed for allergic rhinitis and allergic asthma. His previous allergy office visit was on 01/17/2019 with Dr. Nelva Bush.   Since this visit he states he has had issues with his GI system and did have a hospitalization in March and his abdominal pain was diagnosed to be secondary to pneumatosis intestinalis however he was able to have conservative management with IV fluids and antibiotics.  He was started on omeprazole after this as well.  He states otherwise he has been doing well from an allergy standpoint.  However he does report having a sinusitis several weeks ago when he was treated with amoxicillin and his symptoms have improved.  He does state that the amoxicillin did cause some abdominal pain and discomfort.  He does report having symptoms of frontal headache, pain around and behind his eyes as well as increase in sinus pressure and congestion.  The symptoms have completely  resolved. He states that he is not having any issues with his allergies or his asthma at this time.  He is taking Flonase twice a day as well as Singulair daily. Of note at his last visit he was staying in a home that was quite old and did have reports of mold.  He has since moved out of this home and into a new or home that he states does not have any mold issues.  He states he could tell right away when he moved out of that home and into his new home that his allergy symptoms had pretty much resolved.  He is sensitive to molds on his environmental panel. From a asthma standpoint he states he has not had any further cough or respiratory symptoms since he has been on Singulair and Breo 100 mcg 1 puff daily.  He has not required use of his albuterol at all.  He has not had any ED or urgent care visits or hospitalizations nor has he required any systemic steroids related to his asthma.   Assessment and Plan: Xerxes is a 78 y.o. male with:   Allergic rhinitis  -  Environmental allergy panel showed sensitivity to molds.  Continue avoidance measures for molds.  -  Continue use of Flonase 1 spray twice a day for control of nasal congestion  -  Continue Singulair 10mg  daily - take at bedtime  -  If needed may take an antihistamine like Zyrtec, Allegra or Xyzal  Asthma, allergic driven  - Symptoms have been well controlled and will stepdown therapy  - Stop Breo and change to Arnuity 200 - 1 puff daily  -  If you have return of asthma symptoms or you are not meeting the below goals then go back up to [facility] as this was controlling symptoms very well and notify our office.    - Singulair as above  - have access to albuterol inhaler 2 puffs every 4-6 hours as needed for cough/wheeze/shortness of breath/chest tightness.  May use 15-20 minutes prior to activity.   Monitor frequency of use.   Control goals:   Full participation in all desired activities (may need albuterol before activity)  Albuterol use  two time or less a week on average (not counting use with activity)  Cough interfering with sleep two time or less a month  Oral steroids no more than once a year  No hospitalizations   Follow-up 3-4 months or sooner if needed   Diagnostics: None.  Medication List:  Current Outpatient Medications  Medication Sig Dispense Refill  . amoxicillin (AMOXIL) 500 MG capsule     . atorvastatin (LIPITOR) 80 MG tablet Take 80 mg by mouth daily.    Marland Kitchen BREO ELLIPTA 100-25 MCG/INH AEPB Inhale 1 puff into the lungs daily.     . hydrochlorothiazide (HYDRODIURIL) 25 MG tablet     . losartan (COZAAR) 50 MG tablet Take 100 mg by mouth daily.    . metoprolol succinate (TOPROL-XL) 100 MG 24 hr tablet Take 100 mg by mouth daily.    . montelukast (SINGULAIR) 10 MG tablet Take 10 mg by mouth at bedtime.     Marland Kitchen omeprazole (PRILOSEC) 20 MG capsule     . PREVALITE 4 g packet     . sertraline (ZOLOFT) 50 MG tablet Take 50 mg by mouth daily.    . tamsulosin (FLOMAX) 0.4 MG CAPS capsule Take 0.4 mg by mouth daily.    Marland Kitchen albuterol (VENTOLIN HFA) 108 (90 Base) MCG/ACT inhaler Inhale 1-2 puffs into the lungs every 4 (four) hours as needed for wheezing.     . Fluticasone Furoate (ARNUITY ELLIPTA) 200 MCG/ACT AEPB Inhale 1 puff into the lungs daily. 30 each 5   No current facility-administered medications for this visit.    Allergies: Allergies  Allergen Reactions  . Levaquin [Levofloxacin In D5w] Other (See Comments)    Joint pain and upset stomach   . Morphine And Related Nausea And Vomiting  . Promethazine Anxiety  . Sulfa Antibiotics Rash   I reviewed his past medical history, social history, family history, and environmental history and no significant changes have been reported from previous visit on 01/17/2019.  Review of Systems  Constitutional: Negative for chills and fever.  HENT: Positive for congestion, sinus pressure and sinus pain. Negative for ear pain, postnasal drip, rhinorrhea, sneezing and  sore throat.   Eyes: Negative for discharge, redness and itching.  Respiratory: Negative for cough, chest tightness, shortness of breath and wheezing.   Cardiovascular: Negative.   Gastrointestinal: Positive for abdominal pain.  Musculoskeletal: Negative for myalgias.  Skin: Negative for rash.  Neurological: Positive for headaches.   Objective: Physical Exam Not obtained as encounter was done via telephone.   Previous notes and tests were reviewed.  I discussed the assessment and treatment plan with the patient. The patient was provided an opportunity to ask questions and all were answered. The patient agreed with the plan and demonstrated an understanding of the instructions.   The patient was advised to call back or seek an in-person evaluation if the symptoms worsen or if the condition fails to improve as anticipated.  I provided 20  minutes of non-face-to-face time during this encounter.  It was my pleasure to participate in Nachum Hobson's care today. Please feel free to contact me with any questions or concerns.   Sincerely,  Roderic Lammert Charmian Muff, MD

## 2019-05-23 DIAGNOSIS — H3581 Retinal edema: Secondary | ICD-10-CM | POA: Diagnosis not present

## 2019-05-23 DIAGNOSIS — H3562 Retinal hemorrhage, left eye: Secondary | ICD-10-CM | POA: Diagnosis not present

## 2019-05-23 DIAGNOSIS — H2513 Age-related nuclear cataract, bilateral: Secondary | ICD-10-CM | POA: Diagnosis not present

## 2019-05-23 DIAGNOSIS — H35033 Hypertensive retinopathy, bilateral: Secondary | ICD-10-CM | POA: Diagnosis not present

## 2019-05-23 DIAGNOSIS — H35373 Puckering of macula, bilateral: Secondary | ICD-10-CM | POA: Diagnosis not present

## 2019-05-23 DIAGNOSIS — H34832 Tributary (branch) retinal vein occlusion, left eye, with macular edema: Secondary | ICD-10-CM | POA: Diagnosis not present

## 2019-06-25 DIAGNOSIS — M67442 Ganglion, left hand: Secondary | ICD-10-CM | POA: Diagnosis not present

## 2019-06-25 DIAGNOSIS — K580 Irritable bowel syndrome with diarrhea: Secondary | ICD-10-CM | POA: Diagnosis not present

## 2019-06-25 DIAGNOSIS — I10 Essential (primary) hypertension: Secondary | ICD-10-CM | POA: Diagnosis not present

## 2019-07-02 ENCOUNTER — Telehealth: Payer: Self-pay

## 2019-07-02 NOTE — Telephone Encounter (Signed)
Patient is calling stating his Arnuity is $47 and he is calling to see if he can get a cheaper inhaler.   Please Advise.

## 2019-07-17 ENCOUNTER — Encounter: Payer: Self-pay | Admitting: Gastroenterology

## 2019-07-17 DIAGNOSIS — Z8719 Personal history of other diseases of the digestive system: Secondary | ICD-10-CM | POA: Diagnosis not present

## 2019-07-17 DIAGNOSIS — Z9049 Acquired absence of other specified parts of digestive tract: Secondary | ICD-10-CM | POA: Diagnosis not present

## 2019-07-17 DIAGNOSIS — C189 Malignant neoplasm of colon, unspecified: Secondary | ICD-10-CM | POA: Diagnosis not present

## 2019-08-10 DIAGNOSIS — K219 Gastro-esophageal reflux disease without esophagitis: Secondary | ICD-10-CM | POA: Diagnosis not present

## 2019-08-10 DIAGNOSIS — M67442 Ganglion, left hand: Secondary | ICD-10-CM | POA: Diagnosis not present

## 2019-08-10 DIAGNOSIS — K580 Irritable bowel syndrome with diarrhea: Secondary | ICD-10-CM | POA: Diagnosis not present

## 2019-08-10 DIAGNOSIS — Z23 Encounter for immunization: Secondary | ICD-10-CM | POA: Diagnosis not present

## 2019-08-13 ENCOUNTER — Telehealth: Payer: Self-pay | Admitting: Allergy

## 2019-08-13 NOTE — Telephone Encounter (Signed)
Patient would like his inhaler switched back to [facility]. He was put on Arnuity and says it is not working.

## 2019-08-13 NOTE — Telephone Encounter (Signed)
Dr Padgett please advise 

## 2019-08-14 DIAGNOSIS — M79645 Pain in left finger(s): Secondary | ICD-10-CM | POA: Diagnosis not present

## 2019-08-14 MED ORDER — BREO ELLIPTA 100-25 MCG/INH IN AEPB: 1.0000 | INHALATION_SPRAY | Freq: Every day | RESPIRATORY_TRACT | 3 refills | Status: AC

## 2019-08-14 NOTE — Telephone Encounter (Signed)
Is he needing more albuterol use?   If so ok to step back up to Breo 14mcg 1 puff daily

## 2019-08-14 NOTE — Telephone Encounter (Signed)
Spoke with patient and he stated that he has had some episodes of wheezing recently and had to use his Albuterol 2 times yesterday. Informed him to go ahead and go back on Breo and sent script to pharmacy. Patient agreed with plan.

## 2019-08-23 ENCOUNTER — Ambulatory Visit: Payer: Medicare Other | Admitting: Allergy

## 2019-08-23 DIAGNOSIS — M7989 Other specified soft tissue disorders: Secondary | ICD-10-CM | POA: Diagnosis not present

## 2019-08-24 ENCOUNTER — Ambulatory Visit: Payer: Medicare Other | Admitting: Allergy

## 2019-08-27 DIAGNOSIS — H3581 Retinal edema: Secondary | ICD-10-CM | POA: Diagnosis not present

## 2019-08-27 DIAGNOSIS — H35033 Hypertensive retinopathy, bilateral: Secondary | ICD-10-CM | POA: Diagnosis not present

## 2019-08-27 DIAGNOSIS — H35373 Puckering of macula, bilateral: Secondary | ICD-10-CM | POA: Diagnosis not present

## 2019-08-27 DIAGNOSIS — H2513 Age-related nuclear cataract, bilateral: Secondary | ICD-10-CM | POA: Diagnosis not present

## 2019-08-27 DIAGNOSIS — H34832 Tributary (branch) retinal vein occlusion, left eye, with macular edema: Secondary | ICD-10-CM | POA: Diagnosis not present

## 2019-08-28 ENCOUNTER — Ambulatory Visit (INDEPENDENT_AMBULATORY_CARE_PROVIDER_SITE_OTHER): Payer: Medicare Other | Admitting: Allergy

## 2019-08-28 ENCOUNTER — Encounter: Payer: Self-pay | Admitting: Allergy

## 2019-08-28 ENCOUNTER — Other Ambulatory Visit: Payer: Self-pay

## 2019-08-28 DIAGNOSIS — J4531 Mild persistent asthma with (acute) exacerbation: Secondary | ICD-10-CM

## 2019-08-28 DIAGNOSIS — J3089 Other allergic rhinitis: Secondary | ICD-10-CM

## 2019-08-28 MED ORDER — ALBUTEROL SULFATE HFA 108 (90 BASE) MCG/ACT IN AERS: INHALATION_SPRAY | RESPIRATORY_TRACT | 0 refills | Status: AC

## 2019-08-28 MED ORDER — BREO ELLIPTA 200-25 MCG/INH IN AEPB: 1.0000 | INHALATION_SPRAY | Freq: Every day | RESPIRATORY_TRACT | 1 refills | Status: AC

## 2019-08-28 NOTE — Progress Notes (Signed)
RE: Benjamin Stanley MRN: [id number] DOB: May 18, 1941 Date of Telemedicine Visit: 08/28/2019  Referring provider: [name]* Primary care provider: [name], MD  Chief Complaint: Asthma and Allergic Rhinitis    Telemedicine Follow Up Visit via Telephone: I connected with Benjamin Stanley for a follow up on 08/28/19 by telephone and verified that I am speaking with the correct person using two identifiers.   I discussed the limitations, risks, security and privacy concerns of performing an evaluation and management service by telephone and the availability of in person appointments. I also discussed with the patient that there may be a patient responsible charge related to this service. The patient expressed understanding and agreed to proceed.  Patient is at home.  Provider is at the office.  Visit start time: 1035 Visit end time: [place] consent/check in by: Lupita Raider Medical consent and medical assistant/nurse: Girtha Rm  History of Present Illness: He is a 78 y.o. male, who is being followed for asthma and allergic rhinitis. His previous allergy office visit was on 05/17/19 with Dr. Nelva Bush.   At his last visit his asthma was well controlled on Breo thus decided to try to step-down therapy to [place].  However he called the office back after this change and report he was having wheezing and needing to use albuterol thus we recommend he go back to his Breo 123mcg.    He states for the past 2 weeks he has had increased congestion and draingae as well as cough and wheezing.  He reports using his ventolin twice yesterday with relief of symptoms.  He is taking Breo 177mcg 1 puff daily.  singulair daily.  He states he has been doing nasal saline rinses last week which helped with the sinus pressure and congestion however he did not think to use any nasal sprays.  He states has been taking benadryl as needed at night.   He states he normally has similar  symptoms each year around this time and feels its par for the course.   He denies fevers, chill, nightsweats.  No headache, SOB or chest tightness.    Assessment and Plan: Benjamin Stanley is a 78 y.o. male with:   Asthma, allergic driven  - with current exacerbation  - will step -up to Breo 237mcg 1 puff daily for next month.   Plan to reduce back to Breo 127mcg once symptoms have improved.   This is in effort to prevent need for systemic steroids (ie. Prednisone)  - Singulair as above  - have access to albuterol inhaler 2 puffs every 4-6 hours as needed for cough/wheeze/shortness of breath/chest tightness.  May use 15-20 minutes prior to activity.   Monitor frequency of use.   Control goals:   Full participation in all desired activities (may need albuterol before activity)  Albuterol use two time or less a week on average (not counting use with activity)  Cough interfering with sleep two time or less a month  Oral steroids no more than once a year  No hospitalizations  Allergic rhinitis  -  Continue avoidance measures for molds.  -  Resume use of Flonase 1 spray twice a day for control of nasal congestion  -  Continue Singulair 10mg  daily - take at bedtime  - recommend taking daily antihistamine at this time like Zyrtec 10mg   - continue nasal saline rinses   Follow-up 3-4 months or sooner if needed   Diagnostics: None.  Medication List:  Current Outpatient Medications  Medication Sig Dispense Refill  .  albuterol (VENTOLIN HFA) 108 (90 Base) MCG/ACT inhaler Can inhale two puffs every four to six hours as needed for cough/wheeze/shortness of breath/chest tightness. 54 g 0  . atorvastatin (LIPITOR) 80 MG tablet Take 80 mg by mouth daily.    Marland Kitchen BREO ELLIPTA 100-25 MCG/INH AEPB Inhale 1 puff into the lungs daily. 30 each 3  . esomeprazole (NEXIUM) 20 MG capsule Take 20 mg by mouth daily at 12 noon.    . hydrochlorothiazide (HYDRODIURIL) 25 MG tablet     . losartan (COZAAR) 50 MG tablet  Take 100 mg by mouth daily.    . metoprolol succinate (TOPROL-XL) 100 MG 24 hr tablet Take 100 mg by mouth daily.    . montelukast (SINGULAIR) 10 MG tablet Take 10 mg by mouth at bedtime.     . sertraline (ZOLOFT) 50 MG tablet Take 50 mg by mouth daily.    . tamsulosin (FLOMAX) 0.4 MG CAPS capsule Take 0.4 mg by mouth daily.    . fluticasone furoate-vilanterol (BREO ELLIPTA) 200-25 MCG/INH AEPB Inhale 1 puff into the lungs daily. Rinse, gargle, and spit after use. 60 each 1   No current facility-administered medications for this visit.    Allergies: Allergies  Allergen Reactions  . Levaquin [Levofloxacin In D5w] Other (See Comments)    Joint pain and upset stomach   . Morphine And Related Nausea And Vomiting  . Promethazine Anxiety  . Sulfa Antibiotics Rash   I reviewed his past medical history, social history, family history, and environmental history and no significant changes have been reported from previous visit on 05/17/2019.  Review of Systems  Constitutional: Negative for chills and fever.  HENT: Positive for congestion, rhinorrhea and sinus pressure. Negative for nosebleeds.   Eyes: Negative for discharge and itching.  Respiratory: Positive for cough and wheezing. Negative for chest tightness and shortness of breath.   Cardiovascular: Negative.   Gastrointestinal: Negative.   Musculoskeletal: Negative.   Neurological: Negative.    Objective: Physical Exam Not obtained as encounter was done via telephone.   Previous notes and tests were reviewed.  I discussed the assessment and treatment plan with the patient. The patient was provided an opportunity to ask questions and all were answered. The patient agreed with the plan and demonstrated an understanding of the instructions.   The patient was advised to call back or seek an in-person evaluation if the symptoms worsen or if the condition fails to improve as anticipated.  I provided  18 minutes of non-face-to-face time  during this encounter.  It was my pleasure to participate in Benjamin Stanley's care today. Please feel free to contact me with any questions or concerns.   Sincerely,  Autymn Omlor Charmian Muff, MD

## 2019-08-28 NOTE — Patient Instructions (Addendum)
Asthma, allergic driven  - with current exacerbation  - will step -up to Breo 217mcg 1 puff daily for next month.   Plan to reduce back to Breo 151mcg once symptoms have improved.   This is in effort to prevent need for systemic steroids (ie. Prednisone)  - Singulair as above  - have access to albuterol inhaler 2 puffs every 4-6 hours as needed for cough/wheeze/shortness of breath/chest tightness.  May use 15-20 minutes prior to activity.   Monitor frequency of use.   Control goals:   Full participation in all desired activities (may need albuterol before activity)  Albuterol use two time or less a week on average (not counting use with activity)  Cough interfering with sleep two time or less a month  Oral steroids no more than once a year  No hospitalizations  Allergic rhinitis  -  Continue avoidance measures for molds.  -  Resume use of Flonase 1 spray twice a day for control of nasal congestion  -  Continue Singulair 10mg  daily - take at bedtime  - recommend taking daily antihistamine at this time like Zyrtec 10mg   - continue nasal saline rinses   Follow-up 3-4 months or sooner if needed

## 2019-08-29 ENCOUNTER — Other Ambulatory Visit: Payer: Self-pay

## 2019-08-29 DIAGNOSIS — M67442 Ganglion, left hand: Secondary | ICD-10-CM | POA: Diagnosis not present

## 2019-08-30 ENCOUNTER — Encounter: Payer: Self-pay | Admitting: Gastroenterology

## 2019-08-30 ENCOUNTER — Ambulatory Visit (INDEPENDENT_AMBULATORY_CARE_PROVIDER_SITE_OTHER): Payer: Medicare Other | Admitting: Gastroenterology

## 2019-08-30 VITALS — BP 110/78 | HR 82 | Temp 98.2°F | Ht 70.0 in | Wt 266.1 lb

## 2019-08-30 DIAGNOSIS — R1084 Generalized abdominal pain: Secondary | ICD-10-CM | POA: Diagnosis not present

## 2019-08-30 DIAGNOSIS — R140 Abdominal distension (gaseous): Secondary | ICD-10-CM

## 2019-08-30 NOTE — Patient Instructions (Addendum)
If you are age 80 or older, your body mass index should be between 23-30. Your Body mass index is 38.18 kg/m. If this is out of the aforementioned range listed, please consider follow up with your Primary Care Provider.  If you are age 40 or younger, your body mass index should be between 19-25. Your Body mass index is 38.18 kg/m. If this is out of the aformentioned range listed, please consider follow up with your Primary Care Provider.   _______________________________________________________________________________________   Food Guidelines for gas and bloating  Many people have difficulty digesting certain foods, causing a variety of distressing and embarrassing symptoms such as abdominal pain, bloating and gas.  These foods may need to be avoided or consumed in small amounts.  Here are some tips that might be helpful for you.  1.   Lactose intolerance is the difficulty or complete inability to digest lactose, the natural sugar in milk and anything made from milk.  This condition is harmless, common, and can begin any time during life.  Some people can digest a modest amount of lactose while others cannot tolerate any.  Also, not all dairy products contain equal amounts of lactose.  For example, hard cheeses such as parmesan have less lactose than soft cheeses such as cheddar.  Yogurt has less lactose than milk or cheese.  Many packaged foods (even many brands of bread) have milk, so read ingredient lists carefully.  It is difficult to test for lactose intolerance, so just try avoiding lactose as much as possible for a week and see what happens with your symptoms.  If you seem to be lactose intolerant, the best plan is to avoid it (but make sure you get calcium from another source).  The next best thing is to use lactase enzyme supplements, available over the counter everywhere.  Just know that many lactose intolerant people need to take several tablets with each serving of dairy to avoid symptoms.   Lastly, a lot of restaurant food is made with milk or butter.  Many are things you might not suspect, such as mashed potatoes, rice and pasta (cooked with butter) and "grilled" items.  If you are lactose intolerant, it never hurts to ask your server what has milk or butter.  2.   Fiber is an important part of your diet, but not all fiber is well-tolerated.  Insoluble fiber such as bran is often consumed by normal gut bacteria and converted into gas.  Soluble fiber such as oats, squash, carrots and green beans are typically tolerated better.  3.   Some types of carbohydrates can be poorly digested.  Examples include: fructose (apples, cherries, pears, raisins and other dried fruits), fructans (onions, zucchini, large amounts of wheat), sorbitol/mannitol/xylitol and sucralose/Splenda (common artificial sweeteners), and raffinose (lentils, broccoli, cabbage, asparagus, brussel sprouts, many types of beans).  Do a Development worker, community for [employer] and you will find helpful information. Beano, a dietary supplement, will often help with raffinose-containing foods.  As with lactase tablets, you may need several per serving.  4.   Whenever possible, avoid processed food&meats and chemical additives.  High fructose corn syrup, a common sweetener, may be difficult to digest.  Eggs and soy (comes from the soybean, and added to many foods now) are other common bloating/gassy foods.   Also, try the IB gard, 2 capsules every morning.  Samples given on IBGARD 3 boxes.   Follow up  It was a pleasure to see you today!  Dr. Loletha Carrow

## 2019-08-30 NOTE — Progress Notes (Signed)
[facility] Consult Note:  History: Benjamin Stanley 08/30/2019  Referring provider: [name], MD  Reason for consult/chief complaint: Generalized abdominal pain  Subjective  HPI:  Benjamin Stanley is a very pleasant 78 year old man referred by his primary care provider at [facility] regarding about 2 years of generalized abdominal pain.  He says that he feels well overall, but the last couple of years, probably prior to his cholecystectomy, he has tendencies to bloating that gives him a feeling of "GI distress".  It is not pain as such, and he has noticed many food triggers that he has come to avoid.  He has 2 or 3 alcoholic drinks per day, but does not feel that seems to aggravate it.  His bowel habits are generally regular except for an occasional loose stool that he can attribute to certain foods.  He denies black tarry stool or bright red blood per rectum. He was just hoping for some suggestions to improve "quality of life from the symptoms".  He is clear to point out that he feels well, he has come to live with the symptoms but was just looking for some advice.  Extensive review of clinic in hospital records indicates the patient had a sigmoid colectomy for colon cancer and perforation from colonoscopy (this occurred in [place] in 2013 by his report.).   He had gangrenous cholecystitis resulting in laparoscopic cholecystectomy by Dr. Greer Pickerel in May 2019.  His last visit with a GI physician was Dr. Michail Sermon in September 2018 (epic notes there is an encounter, but no note can be accessed) -Benjamin Stanley says he had a colonoscopy at that time and cannot recall when they were planning to recall him.  Admitted March 2020 with lactic acidosis and CTAP showing pneumatosis of unclear cause.  Surg consulted - conservative mgmt with bowel rest, Abx  He also has intermittent heartburn that is generally under control on once daily Nexium 20 mg.  He takes occasional Maalox for  breakthrough symptoms.  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  ROS:  Review of Systems  Constitutional: Negative for appetite change and unexpected weight change.  HENT: Negative for mouth sores and voice change.   Eyes: Negative for pain and redness.  Respiratory: Negative for cough and shortness of breath.   Cardiovascular: Negative for chest pain and palpitations.  Genitourinary: Negative for dysuria and hematuria.  Musculoskeletal: Negative for arthralgias and myalgias.       Left fifth finger pain after recent surgery  Skin: Negative for pallor and rash.  Neurological: Negative for weakness and headaches.  Hematological: Negative for adenopathy.     Past Medical History: Past Medical History:  Diagnosis Date  . Asthma   . Colon cancer ([place])    s/p resection, stage I, recent colonoscopy negative 4 months ago  . Gastric ulcer   . GERD (gastroesophageal reflux disease)   . Hyperlipidemia   . Hypertension   . IBS (irritable bowel syndrome)      Past Surgical History: Past Surgical History:  Procedure Laterality Date  . CHOLECYSTECTOMY N/A 03/29/2018   Procedure: LAPAROSCOPIC CHOLECYSTECTOMY;  Surgeon: Greer Pickerel, MD;  Location: [facility];  Service: General;  Laterality: N/A;  . EXPLORATORY LAPAROTOMY W/ BOWEL RESECTION  2013   sigmoid colectomy for colon cancer and colon perf after colonoscopy  . KNEE ARTHROSCOPY     several prior knee scopes in the remote past  . NASAL SINUS SURGERY  remote 20+yrs ago  . REPLACEMENT TOTAL KNEE BILATERAL       Family History: Family History  Problem Relation Age of Onset  . Allergic rhinitis Father   . Urticaria Neg Hx   . Immunodeficiency Neg Hx   . Eczema Neg Hx   . Atopy Neg Hx   . Asthma Neg Hx   . Angioedema Neg Hx     Social History: Social History   Socioeconomic History  . Marital status: Divorced    Spouse name: Not on file  . Number of children: Not on file   . Years of education: Not on file  . Highest education level: Not on file  Occupational History  . Not on file  Social Needs  . Financial resource strain: Not on file  . Food insecurity    Worry: Not on file    Inability: Not on file  . Transportation needs    Medical: Not on file    Non-medical: Not on file  Tobacco Use  . Smoking status: Former Research scientist (life sciences)  . Smokeless tobacco: Never Used  Substance and Sexual Activity  . Alcohol use: Yes    Alcohol/week: 3.0 standard drinks    Types: 3 Standard drinks or equivalent per week    Frequency: Never    Comment: drinks martinis about 3x/week  . Drug use: Never  . Sexual activity: Not on file  Lifestyle  . Physical activity    Days per week: Not on file    Minutes per session: Not on file  . Stress: Not on file  Relationships  . Social Herbalist on phone: Not on file    Gets together: Not on file    Attends religious service: Not on file    Active member of club or organization: Not on file    Attends meetings of clubs or organizations: Not on file    Relationship status: Not on file  Other Topics Concern  . Not on file  Social History Narrative  . Not on file    Allergies: Allergies  Allergen Reactions  . Levaquin [Levofloxacin In D5w] Other (See Comments)    Joint pain and upset stomach   . Morphine And Related Nausea And Vomiting  . Promethazine Anxiety  . Sulfa Antibiotics Rash    Outpatient Meds: Current Outpatient Medications  Medication Sig Dispense Refill  . albuterol (VENTOLIN HFA) 108 (90 Base) MCG/ACT inhaler Can inhale two puffs every four to six hours as needed for cough/wheeze/shortness of breath/chest tightness. 54 g 0  . atorvastatin (LIPITOR) 80 MG tablet Take 80 mg by mouth daily.    Marland Kitchen BREO ELLIPTA 100-25 MCG/INH AEPB Inhale 1 puff into the lungs daily. 30 each 3  . esomeprazole (NEXIUM) 20 MG capsule Take 20 mg by mouth daily at 12 noon.    . fexofenadine (ALLEGRA) 180 MG tablet Take  180 mg by mouth daily.    . fluticasone furoate-vilanterol (BREO ELLIPTA) 200-25 MCG/INH AEPB Inhale 1 puff into the lungs daily. Rinse, gargle, and spit after use. 60 each 1  . hydrochlorothiazide (HYDRODIURIL) 25 MG tablet     . losartan (COZAAR) 50 MG tablet Take 100 mg by mouth daily.    . metoprolol succinate (TOPROL-XL) 100 MG 24 hr tablet Take 100 mg by mouth daily.    . montelukast (SINGULAIR) 10 MG tablet Take 10 mg by mouth at bedtime.     . sertraline (ZOLOFT) 50 MG tablet Take 50 mg by mouth daily.    Marland Kitchen  tamsulosin (FLOMAX) 0.4 MG CAPS capsule Take 0.4 mg by mouth daily.     No current facility-administered medications for this visit.       ___________________________________________________________________ Objective   Exam:  BP 110/78 (BP Location: Right Arm, Patient Position: Sitting, Cuff Size: Large)   Pulse 82   Temp 98.2 F (36.8 C) (Oral)   Ht 5\' 10"  (1.778 m)   Wt 266 lb 2 oz (120.7 kg)   BMI 38.18 kg/m    General: Well-appearing, pleasant and conversational  Eyes: sclera anicteric, no redness  ENT: oral mucosa moist without lesions, no cervical or supraclavicular lymphadenopathy  CV: RRR without murmur, S1/S2, no JVD, no peripheral edema  Resp: clear to auscultation bilaterally, normal RR and effort noted  GI: soft, obese, no tenderness, with increased bowel sounds. No guarding or palpable organomegaly noted.  Skin; warm and dry, no rash or jaundice noted  Neuro: awake, alert and oriented x 3. Normal gross motor function and fluent speech  Labs:  CBC Latest Ref Rng & Units 02/09/2019 02/08/2019 03/30/2018  WBC 4.0 - 10.5 K/uL 9.0 8.8 9.4  Hemoglobin 13.0 - 17.0 g/dL 13.2 13.4 11.6(L)  Hematocrit 39.0 - 52.0 % 39.0 39.9 35.4(L)  Platelets 150 - 400 K/uL 171 167 176   CMP Latest Ref Rng & Units 02/09/2019 02/08/2019 03/30/2018  Glucose 70 - 99 mg/dL 99 134(H) 132(H)  BUN 8 - 23 mg/dL 12 11 7   Creatinine 0.61 - 1.24 mg/dL 1.03 0.82 0.78  Sodium 135 -  145 mmol/L 134(L) 135 136  Potassium 3.5 - 5.1 mmol/L 3.5 3.4(L) 4.1  Chloride 98 - 111 mmol/L 102 102 106  CO2 22 - 32 mmol/L 25 23 25   Calcium 8.9 - 10.3 mg/dL 8.4(L) 8.9 8.0(L)  Total Protein 6.5 - 8.1 g/dL 5.9(L) 6.1(L) -  Total Bilirubin 0.3 - 1.2 mg/dL 0.9 0.5 -  Alkaline Phos 38 - 126 U/L 57 59 -  AST 15 - 41 U/L 16 14(L) -  ALT 0 - 44 U/L 20 21 -     Radiologic Studies:  CLINICAL DATA:  Abdominal pain with nausea and emesis   EXAM: CT ABDOMEN AND PELVIS WITH CONTRAST   TECHNIQUE: Multidetector CT imaging of the abdomen and pelvis was performed using the standard protocol following bolus administration of intravenous contrast.   CONTRAST:  182mL OMNIPAQUE IOHEXOL 300 MG/ML  SOLN   COMPARISON:  CTA of the abdomen 03/28/2018   FINDINGS: Lower chest:  No contributory findings.   Hepatobiliary: Extensive portal venous gas in the anti dependent liver. No primary hepatic abnormality. Cholecystectomywith no bile duct dilatation   Pancreas: Unremarkable.   Spleen: Unremarkable.   Adrenals/Urinary Tract: 16 mm right adrenal nodule that is stable from 2019, consistent with adenoma. No hydronephrosis or stone. Bilateral renal cystic densities. Unremarkable bladder.   Stomach/Bowel: There is extensive portal venous gas associated with vessels of the transverse colon and mid small bowel. The underlying bowel loops are not thickened and do not show clear pneumatosis. No pneumoperitoneum. Colonic diverticulosis.   Vascular/Lymphatic: Atherosclerotic calcification. No acute arterial finding. Venous findings above. No mass or adenopathy.   Reproductive:Enlarged prostate.   Other: No ascites or pneumoperitoneum.  Fatty right inguinal hernia.   Musculoskeletal: Advanced spinal degeneration and spinal stenosis. L3-4 degenerative anterolisthesis.   These results were called by telephone at the time of interpretation on 02/08/2019 at 4:46 am to Dr. Duffy Bruce , who  verbally acknowledged these results.   IMPRESSION: 1. Extensive portal venous gas  originating from transverse colon and a segment of small bowel. The underlying bowel loops do not appear diseased and hopefully this is benign pneumatosis. Recommend correlation with lactate and cereal abdominal exam to exclude ischemic bowel. 2. Otherwise stable from 2019 CT, as described.     Electronically Signed   By: Monte Fantasia M.D.   On: 02/08/2019 04:46   Assessment: Encounter Diagnoses  Name Primary?  . Abdominal bloating Yes  . Generalized abdominal pain     He has chronic nonspecific low-grade symptoms that sound like maldigestion causing bloating and gas, perhaps exacerbated somewhat after cholecystectomy.  His bowel habits are generally regular, and he has no loss of appetite weight loss or GI bleeding.  Plan:  I gave him some written dietary advice and recommended a trial of 2 capsules a day of IBgard. Prescription antispasmodic not likely to be helpful for the symptoms, and should be used with caution at his age.  He came to see me for another opinion today, so will follow-up as needed and otherwise be recalled for colonoscopy by [facility] at their discretion based on previous findings.  Thank you for the courtesy of this consult.  Please call me with any questions or concerns.  Nelida Meuse III  CC: Referring provider noted above

## 2019-12-19 ENCOUNTER — Ambulatory Visit: Payer: Medicare Other

## 2019-12-28 ENCOUNTER — Ambulatory Visit: Payer: Medicare Other | Attending: Internal Medicine

## 2019-12-28 DIAGNOSIS — Z23 Encounter for immunization: Secondary | ICD-10-CM | POA: Insufficient documentation

## 2019-12-28 NOTE — Progress Notes (Signed)
   Covid-19 Vaccination Clinic  Name:  Benjamin Stanley    MRN: [id number] DOB: [birth year]  12/28/2019  [name] was observed post Covid-19 immunization for 15 minutes without incidence. He was provided with Vaccine Information Sheet and instruction to access the V-Safe system.   [name] was instructed to call 911 with any severe reactions post vaccine: Marland Kitchen Difficulty breathing  . Swelling of your face and throat  . A fast heartbeat  . A bad rash all over your body  . Dizziness and weakness    Immunizations Administered    Name Date Dose VIS Date Route   Pfizer COVID-19 Vaccine 12/28/2019  8:23 AM 0.3 mL 11/02/2019 Intramuscular   Manufacturer: [place]   Lot: [id number]   [place]: [id number]

## 2020-01-22 ENCOUNTER — Ambulatory Visit: Payer: Medicare Other | Attending: Internal Medicine

## 2020-01-22 DIAGNOSIS — Z23 Encounter for immunization: Secondary | ICD-10-CM

## 2020-01-22 NOTE — Progress Notes (Signed)
   Covid-19 Vaccination Clinic  Name:  Climmie Slovick    MRN: [id number] DOB: [birth year]  01/22/2020  Mr. Wignall was observed post Covid-19 immunization for 15 minutes without incident. He was provided with Vaccine Information Sheet and instruction to access the V-Safe system.   Mr. Grindstaff was instructed to call 911 with any severe reactions post vaccine: Marland Kitchen Difficulty breathing  . Swelling of face and throat  . A fast heartbeat  . A bad rash all over body  . Dizziness and weakness   Immunizations Administered    Name Date Dose VIS Date Route   Pfizer COVID-19 Vaccine 01/22/2020  8:30 AM 0.3 mL 11/02/2019 Intramuscular   Manufacturer: [place]   Lot: [id number]   [place]: [id number]

## 2020-02-11 IMAGING — DX CHEST - 2 VIEW
2 series · 2 of 2 positions shown · non-contrast
Comparison: 09/01/2018

CLINICAL DATA: c/o chest pain that woke him from sleep at midnight
tonight. Pt also experienced nausea, one episode of emesis, and
diaphoresis. Pt states chest pain has resolved and moved down into
his abdomen.

EXAM:
CHEST - 2 VIEW

[chest lat]
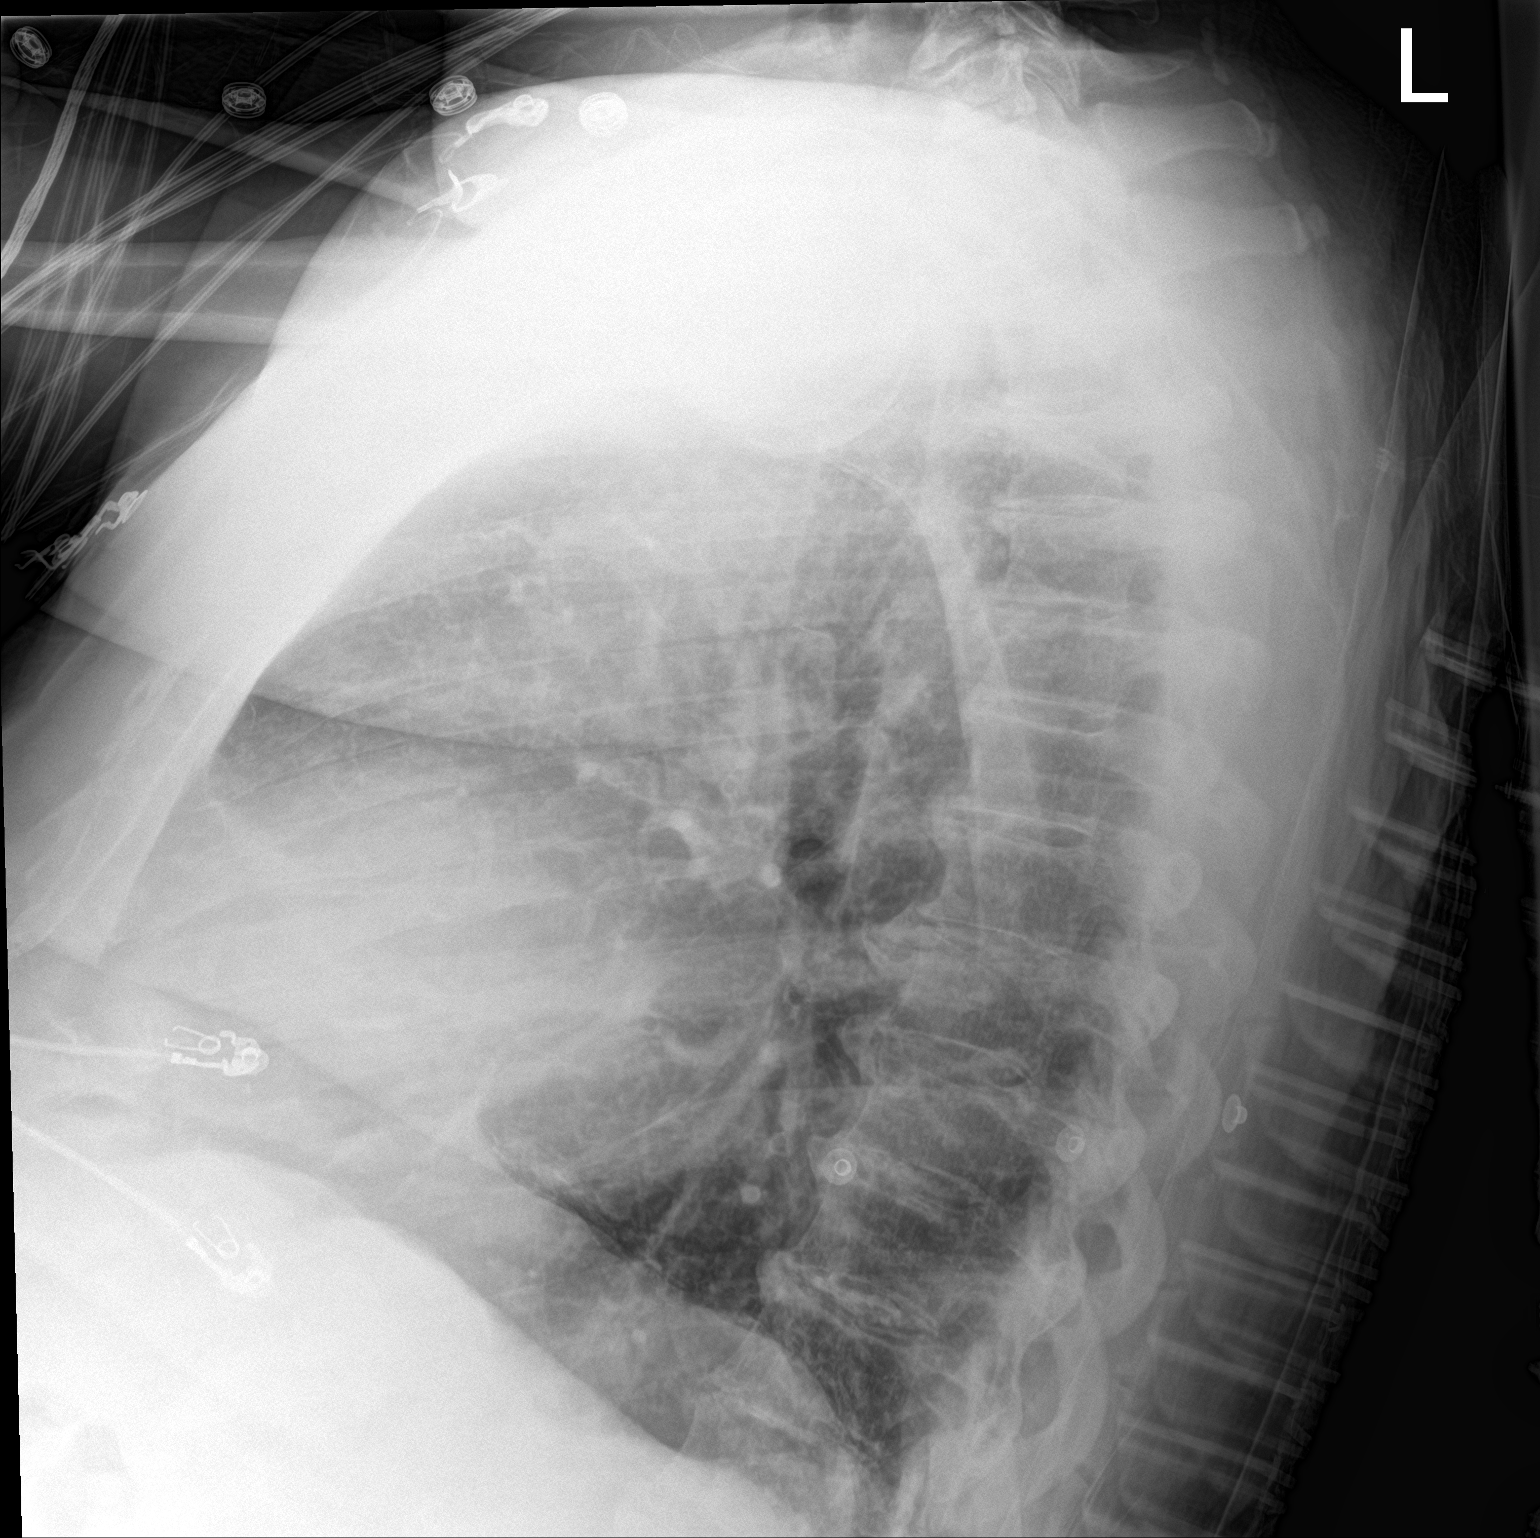

[chest ap]
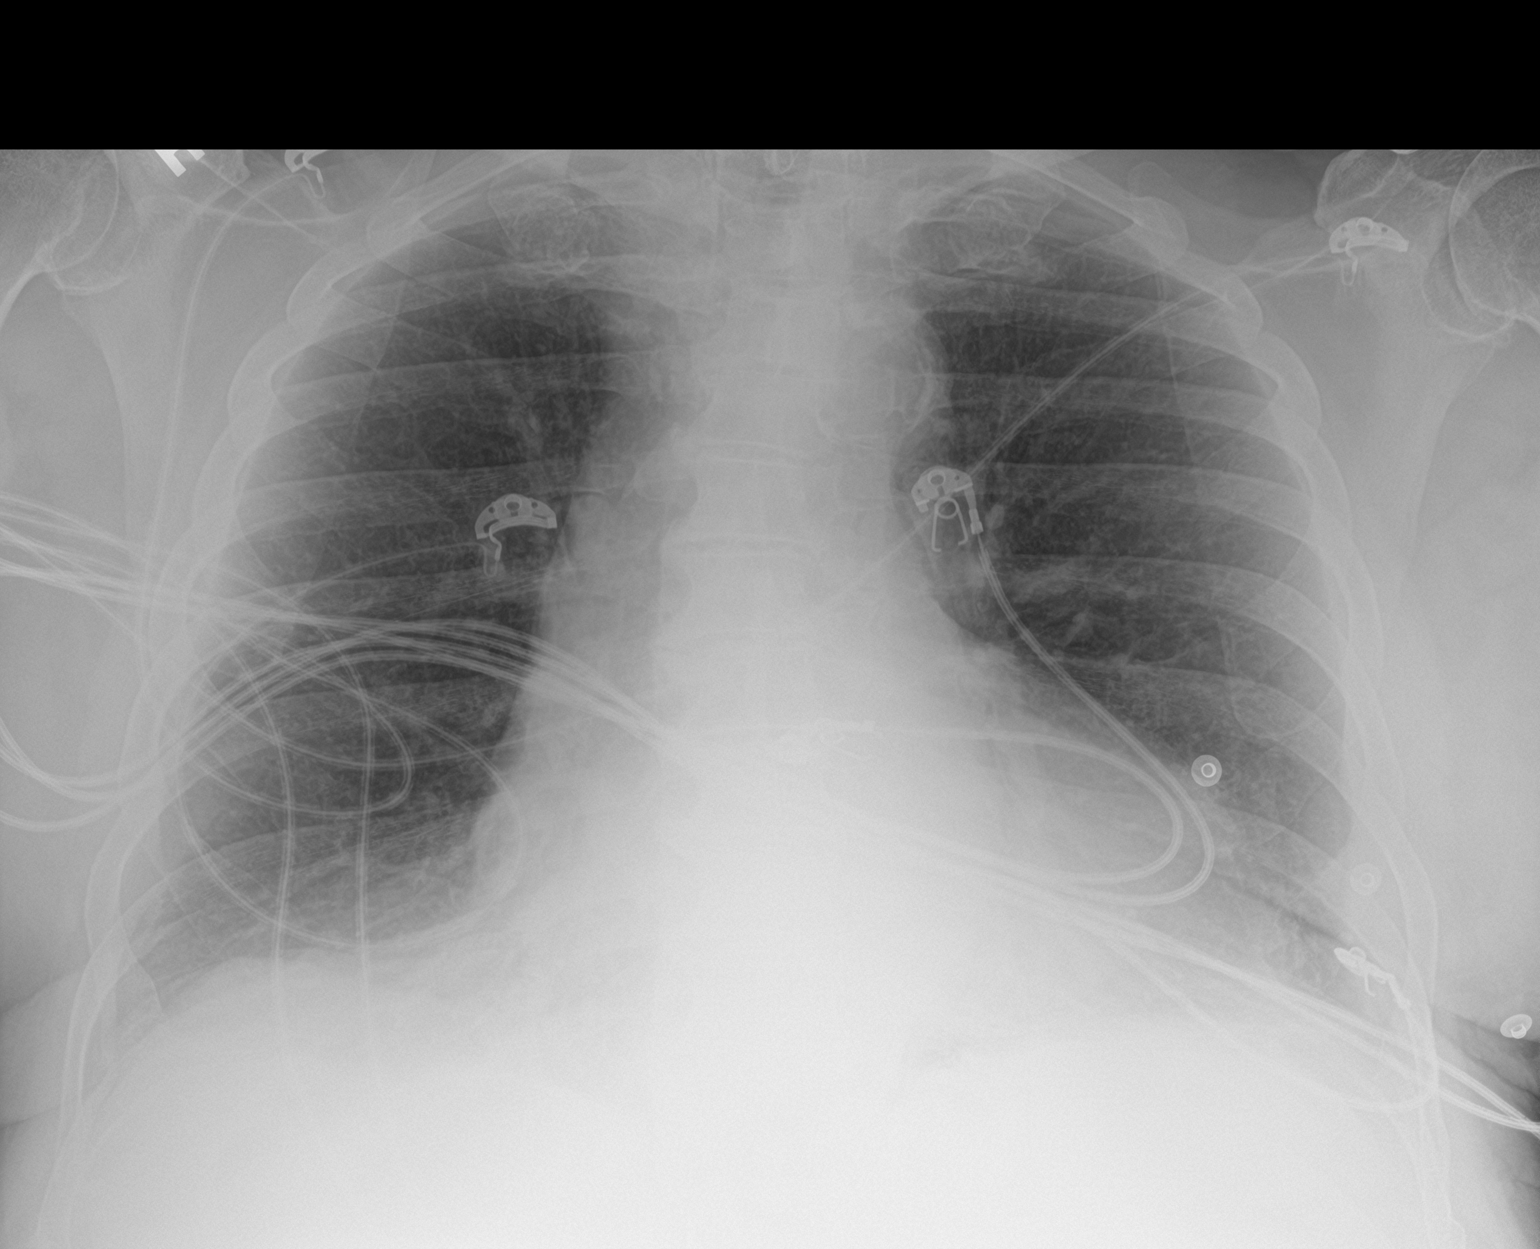

[2 of 2 positions shown; findings below may reference images not displayed]

FINDINGS: Cardiac silhouette is borderline enlarged. No mediastinal or hilar
masses. No evidence of adenopathy.

Lungs are clear.  No pleural effusion or pneumothorax.

Skeletal structures are intact.
IMPRESSION: No active cardiopulmonary disease.

## 2020-02-11 IMAGING — CT CT ABDOMEN AND PELVIS WITH CONTRAST
2 of 5 series · 16 of 46 positions shown, 18 images · IV contrast (Omni 300)
Comparison: CTA of the abdomen 03/28/2018

CLINICAL DATA: Abdominal pain with nausea and emesis

EXAM:
CT ABDOMEN AND PELVIS WITH CONTRAST
TECHNIQUE: Multidetector CT imaging of the abdomen and pelvis was performed
using the standard protocol following bolus administration of
intravenous contrast.
CONTRAST:  125mL OMNIPAQUE IOHEXOL 300 MG/ML  SOLN

[Series 3: a/p w/ 5mm · axial · 0.96mm/px · z∈[+692,+1092]mm · 13 of 90 slices shown, 15 images]
[im 5/90  soft-tissue]
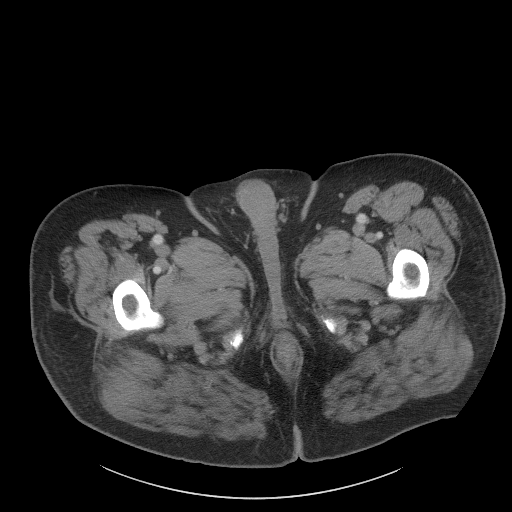
[im 5/90  bone]
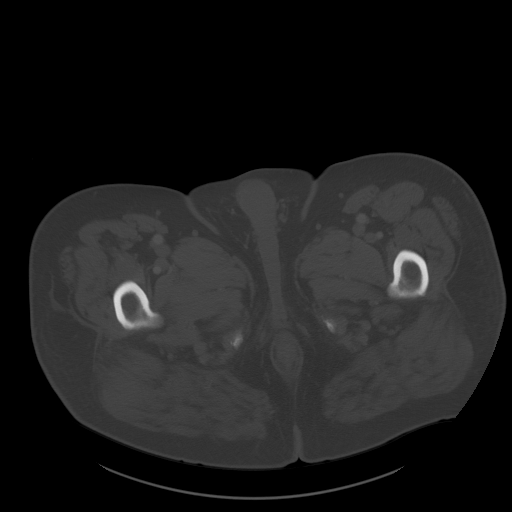
[im 15/90  soft-tissue]
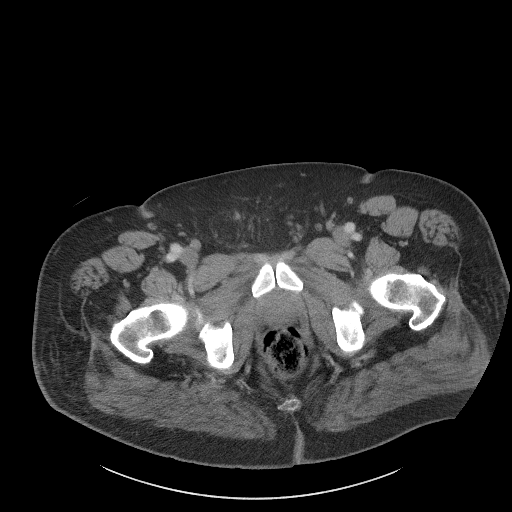
[im 19/90  soft-tissue]
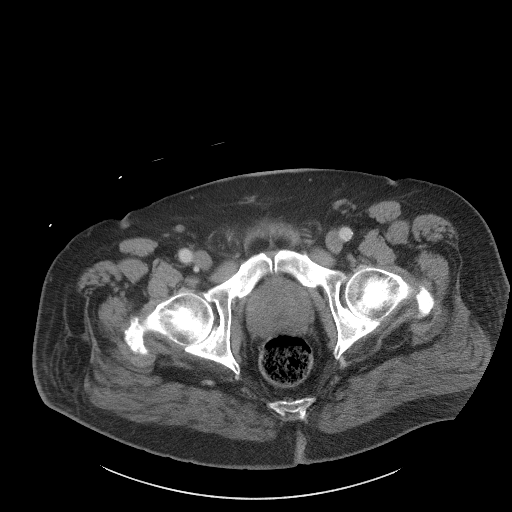
[im 24/90  soft-tissue]
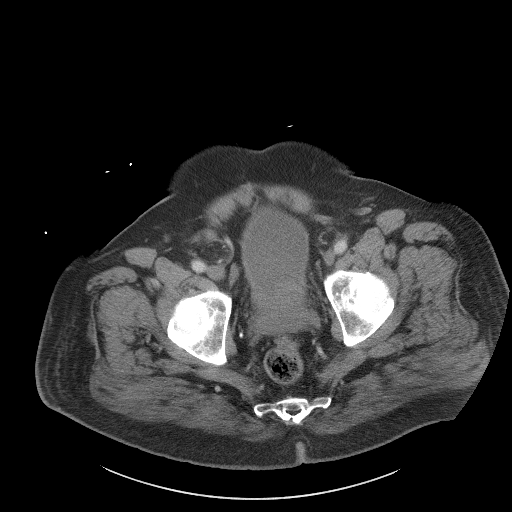
[im 33/90  soft-tissue]
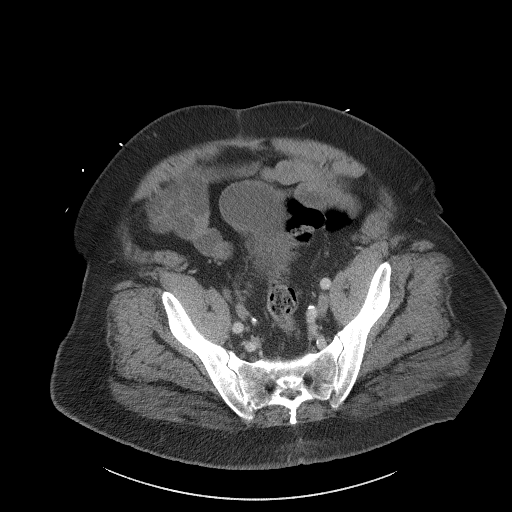
[im 38/90  soft-tissue]
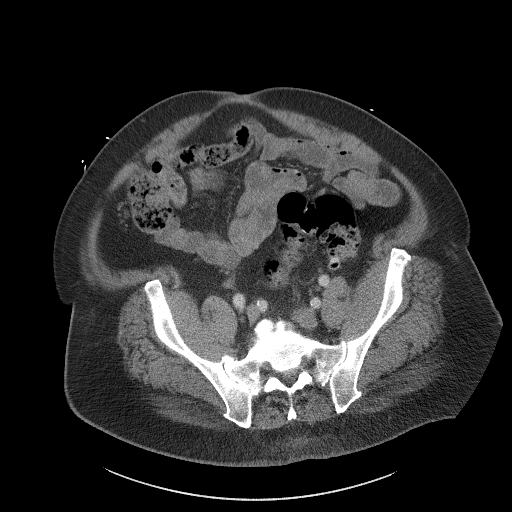
[im 47/90  soft-tissue]
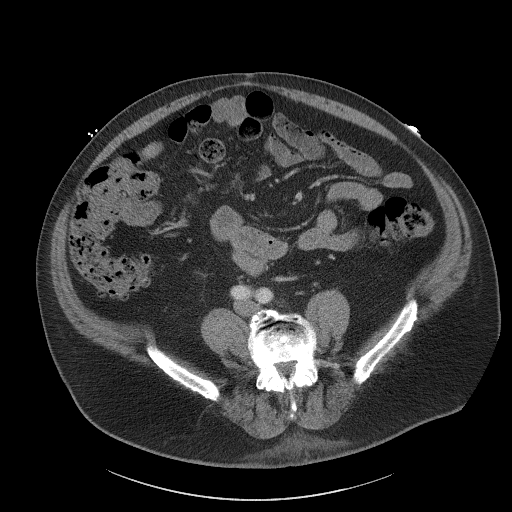
[im 52/90  soft-tissue]
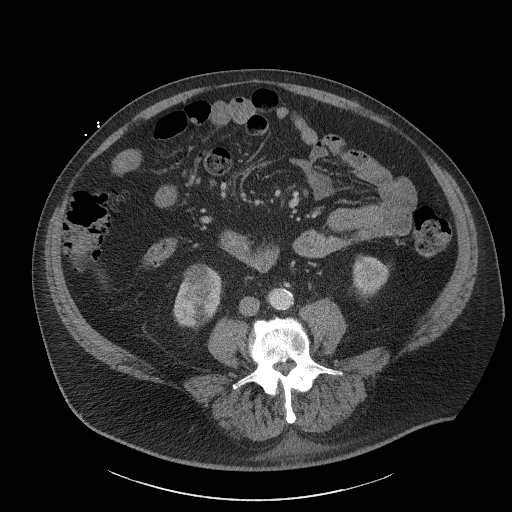
[im 57/90  soft-tissue]
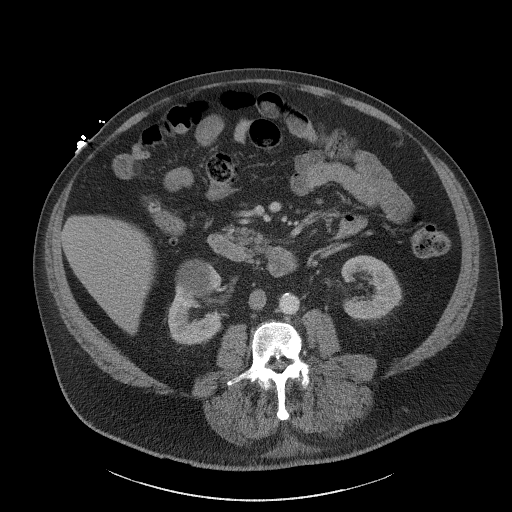
[im 57/90  bone]
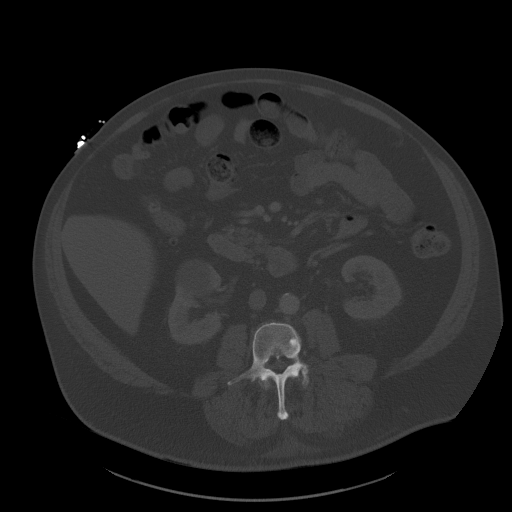
[im 66/90  soft-tissue]
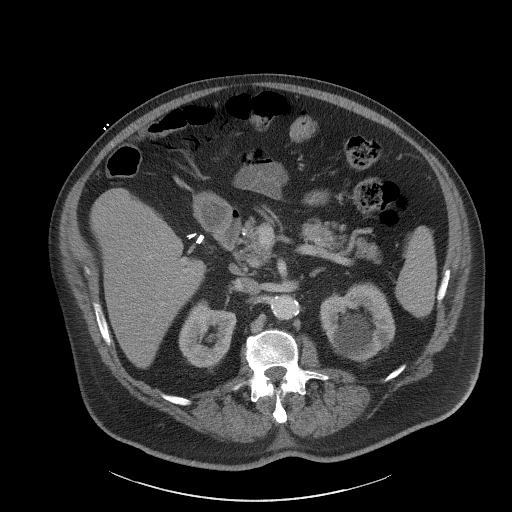
[im 71/90  soft-tissue]
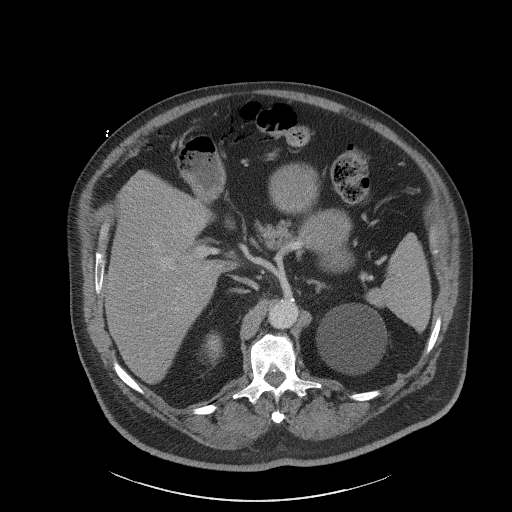
[im 75/90  soft-tissue]
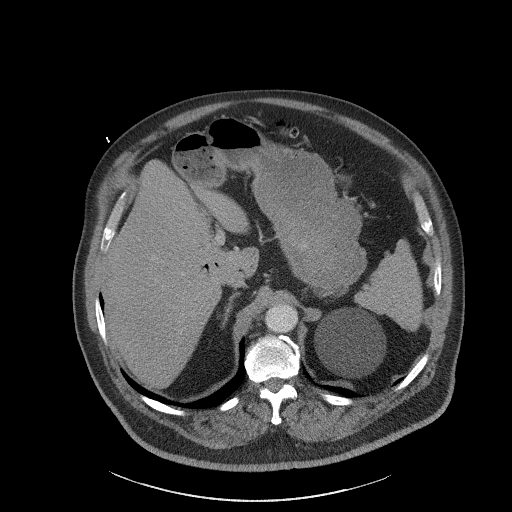
[im 85/90  soft-tissue]
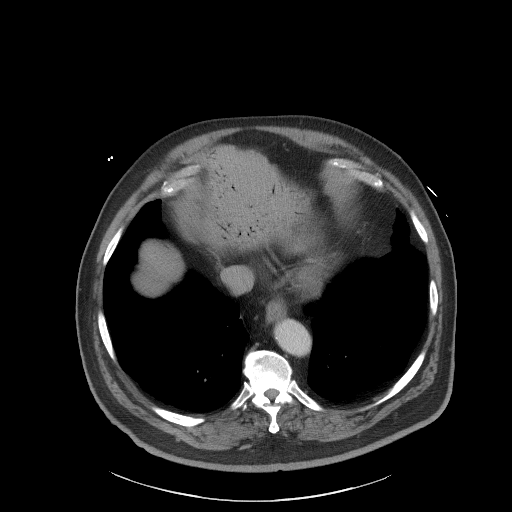

[Series 6: a/p w/ cor · coronal · 0.87mm/px · 3 of 194 slices shown]
[im 65/194  soft-tissue]
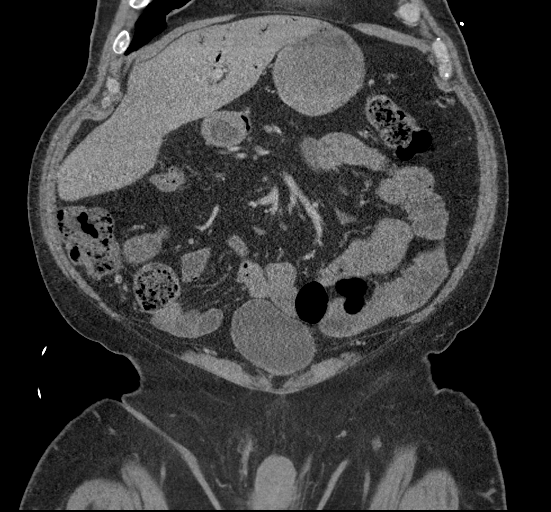
[im 86/194  soft-tissue]
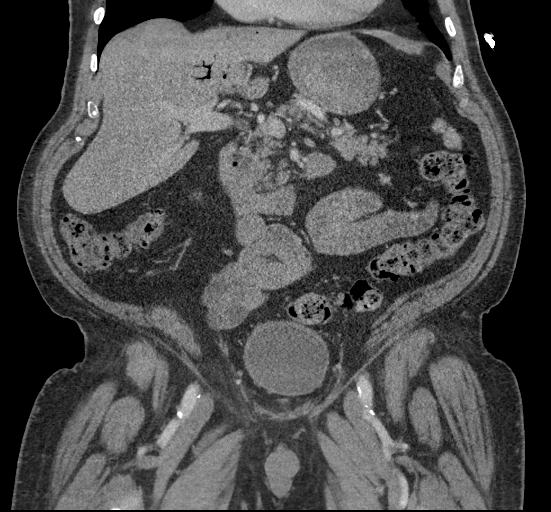
[im 108/194  soft-tissue]
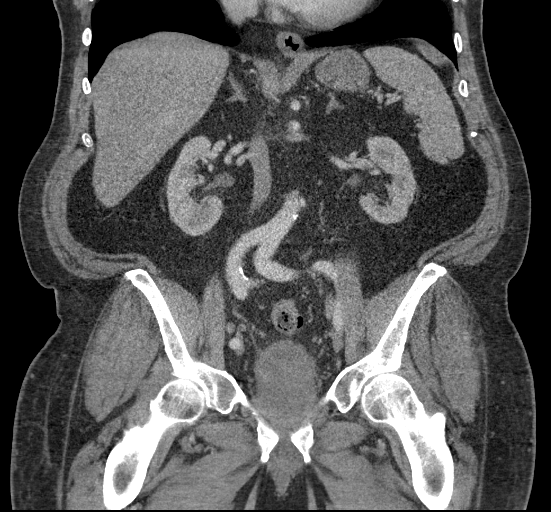

[16 of 46 positions shown; findings below may reference images not displayed]

FINDINGS: Lower chest:  No contributory findings.

Hepatobiliary: Extensive portal venous gas in the anti dependent
liver. No primary hepatic abnormality. Cholecystectomywith no bile
duct dilatation

Pancreas: Unremarkable.

Spleen: Unremarkable.

Adrenals/Urinary Tract: 16 mm right adrenal nodule that is stable
from 6965, consistent with adenoma. No hydronephrosis or stone.
Bilateral renal cystic densities. Unremarkable bladder.

Stomach/Bowel: There is extensive portal venous gas associated with
vessels of the transverse colon and mid small bowel. The underlying
bowel loops are not thickened and do not show clear pneumatosis. No
pneumoperitoneum. Colonic diverticulosis.

Vascular/Lymphatic: Atherosclerotic calcification. No acute arterial
finding. Venous findings above. No mass or adenopathy.

Reproductive:Enlarged prostate.

Other: No ascites or pneumoperitoneum.  Fatty right inguinal hernia.

Musculoskeletal: Advanced spinal degeneration and spinal stenosis.
L3-4 degenerative anterolisthesis.

These results were called by telephone at the time of interpretation
on 02/08/2019 at [DATE] to Dr. SKENZY SEHAB , who verbally
acknowledged these results.
IMPRESSION: 1. Extensive portal venous gas originating from transverse colon and
a segment of small bowel. The underlying bowel loops do not appear
diseased and hopefully this is benign pneumatosis. Recommend
correlation with lactate and cereal abdominal exam to exclude
ischemic bowel.
2. Otherwise stable from 6965 CT, as described.

## 2020-02-17 ENCOUNTER — Other Ambulatory Visit: Payer: Self-pay

## 2020-02-17 ENCOUNTER — Encounter (HOSPITAL_BASED_OUTPATIENT_CLINIC_OR_DEPARTMENT_OTHER): Payer: Self-pay | Admitting: Emergency Medicine

## 2020-02-17 ENCOUNTER — Emergency Department (HOSPITAL_BASED_OUTPATIENT_CLINIC_OR_DEPARTMENT_OTHER)
Admission: EM | Admit: 2020-02-17 | Discharge: 2020-02-18 | Disposition: A | Discharge status: Home / Self Care | Payer: Medicare Other | Attending: Emergency Medicine | Admitting: Emergency Medicine

## 2020-02-17 DIAGNOSIS — Z7901 Long term (current) use of anticoagulants: Secondary | ICD-10-CM | POA: Insufficient documentation

## 2020-02-17 DIAGNOSIS — Z87891 Personal history of nicotine dependence: Secondary | ICD-10-CM | POA: Diagnosis not present

## 2020-02-17 DIAGNOSIS — Z85038 Personal history of other malignant neoplasm of large intestine: Secondary | ICD-10-CM | POA: Insufficient documentation

## 2020-02-17 DIAGNOSIS — I4891 Unspecified atrial fibrillation: Secondary | ICD-10-CM | POA: Diagnosis not present

## 2020-02-17 DIAGNOSIS — Z96653 Presence of artificial knee joint, bilateral: Secondary | ICD-10-CM | POA: Insufficient documentation

## 2020-02-17 DIAGNOSIS — Z79899 Other long term (current) drug therapy: Secondary | ICD-10-CM | POA: Insufficient documentation

## 2020-02-17 DIAGNOSIS — J45909 Unspecified asthma, uncomplicated: Secondary | ICD-10-CM | POA: Insufficient documentation

## 2020-02-17 DIAGNOSIS — I10 Essential (primary) hypertension: Secondary | ICD-10-CM | POA: Insufficient documentation

## 2020-02-17 DIAGNOSIS — R002 Palpitations: Secondary | ICD-10-CM | POA: Diagnosis present

## 2020-02-17 LAB — CBC WITH DIFFERENTIAL/PLATELET
Abs Immature Granulocytes: 0.02 10*3/uL (ref 0.00–0.07)
Basophils Absolute: 0.1 10*3/uL (ref 0.0–0.1)
Basophils Relative: 1 %
Eosinophils Absolute: 0.4 10*3/uL (ref 0.0–0.5)
Eosinophils Relative: 4 %
HCT: 45.5 % (ref 39.0–52.0)
Hemoglobin: 15.7 g/dL (ref 13.0–17.0)
Immature Granulocytes: 0 %
Lymphocytes Relative: 43 %
Lymphs Abs: 4.3 10*3/uL — ABNORMAL HIGH (ref 0.7–4.0)
MCH: 32.6 pg (ref 26.0–34.0)
MCHC: 34.5 g/dL (ref 30.0–36.0)
MCV: 94.6 fL (ref 80.0–100.0)
Monocytes Absolute: 1 10*3/uL (ref 0.1–1.0)
Monocytes Relative: 10 %
Neutro Abs: 4.2 10*3/uL (ref 1.7–7.7)
Neutrophils Relative %: 42 %
Platelets: 213 10*3/uL (ref 150–400)
RBC: 4.81 MIL/uL (ref 4.22–5.81)
RDW: 12.9 % (ref 11.5–15.5)
WBC: 10 10*3/uL (ref 4.0–10.5)
nRBC: 0 % (ref 0.0–0.2)

## 2020-02-17 LAB — BASIC METABOLIC PANEL
Anion gap: 12 (ref 5–15)
BUN: 23 mg/dL (ref 8–23)
CO2: 24 mmol/L (ref 22–32)
Calcium: 9.6 mg/dL (ref 8.9–10.3)
Chloride: 100 mmol/L (ref 98–111)
Creatinine, Ser: 1.01 mg/dL (ref 0.61–1.24)
GFR calc Af Amer: >60 mL/min (ref >60)
GFR calc non Af Amer: >60 mL/min (ref >60)
Glucose, Bld: 105 mg/dL — ABNORMAL HIGH (ref 70–99)
Potassium: 3.5 mmol/L (ref 3.5–5.1)
Sodium: 136 mmol/L (ref 135–145)

## 2020-02-17 MED ORDER — DILTIAZEM HCL 25 MG/5ML IV SOLN
20.0000 mg | Freq: Once | INTRAVENOUS | Status: AC
  Administered 2020-02-18: 20 mg via INTRAVENOUS
  Filled 2020-02-17: qty 5

## 2020-02-17 NOTE — ED Provider Notes (Signed)
[facility] Provider Note: Georgena Spurling, MD, FACEP  CSN: [id number] MRN: [id number] ARRIVAL: 02/17/20 at 2303 ROOM: [place]  Atrial Fibrillation   HISTORY OF PRESENT ILLNESS  02/17/20 11:35 PM Benjamin Stanley is a 79 y.o. male with a history of PVCs but not atrial fibrillation.  He is here with palpitations.  Specifically he felt like his heart rate was speeding up and slowing down throughout the day.  He has an apple watch which showed atrial fibrillation which she has never had in the past.  He has no associated chest pain, lightheadedness, diaphoresis or shortness of breath.  He is not on anticoagulation.  He was noted to have a rate of 122 on arrival but this has varied and been as low as the 80s while on the monitor here.  He recently moved here from [place] and has his first appointment with a cardiologist in 3 days.  He has chronic edema of the lower legs.   Past Medical History:  Diagnosis Date  . Asthma   . Colon cancer ([place])    s/p resection, stage I, recent colonoscopy negative 4 months ago  . Gastric ulcer   . GERD (gastroesophageal reflux disease)   . Hyperlipidemia   . Hypertension   . IBS (irritable bowel syndrome)     Past Surgical History:  Procedure Laterality Date  . CHOLECYSTECTOMY N/A 03/29/2018   Procedure: LAPAROSCOPIC CHOLECYSTECTOMY;  Surgeon: Greer Pickerel, MD;  Location: [facility];  Service: General;  Laterality: N/A;  . EXPLORATORY LAPAROTOMY W/ BOWEL RESECTION  2013   sigmoid colectomy for colon cancer and colon perf after colonoscopy  . KNEE ARTHROSCOPY     several prior knee scopes in the remote past  . NASAL SINUS SURGERY     remote 20+yrs ago  . REPLACEMENT TOTAL KNEE BILATERAL      Family History  Problem Relation Age of Onset  . Allergic rhinitis Father   . Urticaria Neg Hx   . Immunodeficiency Neg Hx   . Eczema Neg Hx   . Atopy Neg Hx   . Asthma Neg Hx   . Angioedema Neg Hx     Social History    Tobacco Use  . Smoking status: Former Research scientist (life sciences)  . Smokeless tobacco: Never Used  Substance Use Topics  . Alcohol use: Yes    Alcohol/week: 3.0 standard drinks    Types: 3 Standard drinks or equivalent per week    Comment: drinks martinis about 3x/week  . Drug use: Never    Prior to Admission medications   Medication Sig Start Date End Date Taking? Authorizing Provider  atorvastatin (LIPITOR) 80 MG tablet Take 80 mg by mouth daily. 01/08/18  Yes [provider]  BREO ELLIPTA 100-25 MCG/INH AEPB Inhale 1 puff into the lungs daily. 08/14/19  Yes Padgett, Rae Halsted, MD  fexofenadine (ALLEGRA) 180 MG tablet Take 180 mg by mouth daily.   Yes [provider]  hydrochlorothiazide (HYDRODIURIL) 25 MG tablet  04/28/19  Yes [provider]  losartan (COZAAR) 50 MG tablet Take 100 mg by mouth daily. 12/04/18  Yes [provider]  metoprolol succinate (TOPROL-XL) 100 MG 24 hr tablet Take 100 mg by mouth daily. 01/06/18  Yes [provider]  montelukast (SINGULAIR) 10 MG tablet Take 10 mg by mouth at bedtime.  01/16/19  Yes [provider]  sertraline (ZOLOFT) 50 MG tablet Take 50 mg by mouth daily. 01/27/18  Yes [provider]  tamsulosin (  FLOMAX) 0.4 MG CAPS capsule Take 0.4 mg by mouth daily. 03/12/18  Yes [provider]  albuterol (VENTOLIN HFA) 108 (90 Base) MCG/ACT inhaler Can inhale two puffs every four to six hours as needed for cough/wheeze/shortness of breath/chest tightness. 08/28/19   [name], MD  diltiazem (CARDIZEM CD) 120 MG 24 hr capsule Take 1 capsule (120 mg total) by mouth daily. 02/18/20   Kameshia Madruga, MD  fluticasone furoate-vilanterol (BREO ELLIPTA) 200-25 MCG/INH AEPB Inhale 1 puff into the lungs daily. Rinse, gargle, and spit after use. 08/28/19   [name], MD  rivaroxaban (XARELTO) 20 MG TABS tablet Take 1 tablet (20 mg total) by mouth daily with supper. 02/18/20   Gaylyn Berish,  Raeley Gilmore, MD  esomeprazole (NEXIUM) 20 MG capsule Take 20 mg by mouth daily at 12 noon.  02/18/20  [provider]    Allergies Levaquin [levofloxacin in d5w], Morphine and related, Promethazine, and Sulfa antibiotics   REVIEW OF SYSTEMS  Negative except as noted here or in the History of Present Illness.   PHYSICAL EXAMINATION  Initial Vital Signs Blood pressure (!) 159/88, pulse (!) 106, resp. rate 17, height 5\' 10"  (1.778 m), weight 117.9 kg, SpO2 97 %.  Examination General: Well-developed, well-nourished male in no acute distress; appearance consistent with age of record HENT: normocephalic; atraumatic Eyes: pupils equal, round and reactive to light; extraocular muscles intact Neck: supple Heart: Irregular rhythm; frequent PVCs Lungs: clear to auscultation bilaterally Abdomen: soft; nondistended; nontender; bowel sounds present Extremities: No deformity; full range of motion; pulses normal; trace edema of lower legs Neurologic: Awake, alert and oriented; motor function intact in all extremities and symmetric; no facial droop Skin: Warm and dry Psychiatric: Normal mood and affect   RESULTS  Summary of this visit's results, reviewed and interpreted by myself:   EKG Interpretation  Date/Time:  Sunday February 17 2020 23:16:09 EDT Ventricular Rate:  85 PR Interval:    QRS Duration: 94 QT Interval:  358 QTC Calculation: 426 R Axis:   8 Text Interpretation: Atrial fibrillation Borderline repolarization abnormality Baseline wander in lead(s) III Previously NSR Confirmed by Joeanne Robicheaux [phone]) on 02/17/2020 11:25:45 PM      Laboratory Studies: Results for orders placed or performed during the hospital encounter of 02/17/20 (from the past 24 hour(s))  CBC with Differential/Platelet     Status: Abnormal   Collection Time: 02/17/20 11:32 PM  Result Value Ref Range   WBC 10.0 4.0 - 10.5 K/uL   RBC 4.81 4.22 - 5.81 MIL/uL   Hemoglobin 15.7 13.0 - 17.0 g/dL   HCT 45.5  39.0 - 52.0 %   MCV 94.6 80.0 - 100.0 fL   MCH 32.6 26.0 - 34.0 pg   MCHC 34.5 30.0 - 36.0 g/dL   RDW 12.9 11.5 - 15.5 %   Platelets 213 150 - 400 K/uL   nRBC 0.0 0.0 - 0.2 %   Neutrophils Relative % 42 %   Neutro Abs 4.2 1.7 - 7.7 K/uL   Lymphocytes Relative 43 %   Lymphs Abs 4.3 (H) 0.7 - 4.0 K/uL   Monocytes Relative 10 %   Monocytes Absolute 1.0 0.1 - 1.0 K/uL   Eosinophils Relative 4 %   Eosinophils Absolute 0.4 0.0 - 0.5 K/uL   Basophils Relative 1 %   Basophils Absolute 0.1 0.0 - 0.1 K/uL   Immature Granulocytes 0 %   Abs Immature Granulocytes 0.02 0.00 - 0.07 K/uL  Basic metabolic panel     Status:  Abnormal   Collection Time: 02/17/20 11:32 PM  Result Value Ref Range   Sodium 136 135 - 145 mmol/L   Potassium 3.5 3.5 - 5.1 mmol/L   Chloride 100 98 - 111 mmol/L   CO2 24 22 - 32 mmol/L   Glucose, Bld 105 (H) 70 - 99 mg/dL   BUN 23 8 - 23 mg/dL   Creatinine, Ser 1.01 0.61 - 1.24 mg/dL   Calcium 9.6 8.9 - 10.3 mg/dL   GFR calc non Af Amer >60 >60 mL/min   GFR calc Af Amer >60 >60 mL/min   Anion gap 12 5 - 15  Troponin I (High Sensitivity)     Status: None   Collection Time: 02/17/20 11:32 PM  Result Value Ref Range   Troponin I (High Sensitivity) 7 <18 ng/L   Imaging Studies: No results found.  ED COURSE and MDM  Nursing notes, initial and subsequent vitals signs, including pulse oximetry, reviewed and interpreted by myself.  Vitals:   02/17/20 2329 02/18/20 0009 02/18/20 0010 02/18/20 0024  BP:    104/67  Pulse:    70  Resp: 17  18 19   Temp:  98.6 F (37 C)    TempSrc:  Oral    SpO2:    93%  Weight:      Height:       Medications  rivaroxaban (XARELTO) tablet 20 mg (has no administration in time range)  diltiazem (CARDIZEM) injection 20 mg (20 mg Intravenous Given 02/18/20 0006)   1:19 AM Rate controlled in 50s to 80s after 20 mg of Cardizem IV.  I was unable to contact the patient's cardiologist, or group, who is scheduled to see in 2 days.  The  patient expressed significant interest in anticoagulation to prevent stroke and we will start him on Xarelto.  I will also start him on Cardizem for rate control (will start a low dose given he is already on metoprolol) pending follow-up and possible cardioversion.   PROCEDURES  Procedures   ED DIAGNOSES     ICD-10-CM   1. New onset atrial fibrillation ([place])  I48.91        Genecis Veley, MD 02/18/20 [phone]

## 2020-02-17 NOTE — ED Triage Notes (Signed)
Reports hx of palpitations with pvc's.  Tonight felt like rate was going up and down so he ran a tracing using his watch that showed afib.  Denies having any hx of afib.

## 2020-02-18 DIAGNOSIS — I4891 Unspecified atrial fibrillation: Secondary | ICD-10-CM | POA: Diagnosis not present

## 2020-02-18 LAB — TROPONIN I (HIGH SENSITIVITY): Troponin I (High Sensitivity): 7 ng/L (ref <18)

## 2020-02-18 MED ORDER — RIVAROXABAN 20 MG PO TABS: 20.0000 mg | ORAL_TABLET | Freq: Every day | ORAL | 0 refills | Status: AC

## 2020-02-18 MED ORDER — RIVAROXABAN 20 MG PO TABS
20.0000 mg | ORAL_TABLET | Freq: Every day | ORAL | Status: DC
  Administered 2020-02-18: 02:00:00 20 mg via ORAL
  Filled 2020-02-18: qty 1

## 2020-02-18 MED ORDER — DILTIAZEM HCL ER COATED BEADS 120 MG PO CP24: 120.0000 mg | ORAL_CAPSULE | Freq: Every day | ORAL | 0 refills | Status: AC

## 2020-02-18 NOTE — ED Notes (Signed)
Left message @ [phone] for Dr. Verdell Face to call Dr. Florina Ou back for a consultation
